# Patient Record
Sex: Female | Born: 1980 | Race: White | Hispanic: No | Marital: Married | Smoking: Former smoker
Health system: Southern US, Community
[De-identification: ages and names within clinical notes are randomized; demographics above are authoritative.]

## PROBLEM LIST (undated history)

## (undated) HISTORY — PX: RHINOPLASTY: SUR1284

---

## 2009-05-26 ENCOUNTER — Ambulatory Visit: Payer: Self-pay | Admitting: Family Medicine

## 2009-05-26 ENCOUNTER — Other Ambulatory Visit: Admission: RE | Admit: 2009-05-26 | Discharge: 2009-05-26 | Payer: Self-pay | Admitting: Family Medicine

## 2009-05-26 ENCOUNTER — Encounter: Payer: Self-pay | Admitting: Family Medicine

## 2009-05-26 DIAGNOSIS — R635 Abnormal weight gain: Secondary | ICD-10-CM | POA: Insufficient documentation

## 2009-05-26 DIAGNOSIS — G4700 Insomnia, unspecified: Secondary | ICD-10-CM | POA: Insufficient documentation

## 2009-05-27 LAB — CONVERTED CEMR LAB
ALT: 11 units/L (ref 0–35)
AST: 14 units/L (ref 0–37)
Albumin: 4.8 g/dL (ref 3.5–5.2)
Alkaline Phosphatase: 41 units/L (ref 39–117)
BUN: 13 mg/dL (ref 6–23)
CO2: 22 meq/L (ref 19–32)
Calcium: 10 mg/dL (ref 8.4–10.5)
Chloride: 105 meq/L (ref 96–112)
Cholesterol: 219 mg/dL — ABNORMAL HIGH (ref 0–200)
Creatinine, Ser: 0.85 mg/dL (ref 0.40–1.20)
Glucose, Bld: 91 mg/dL (ref 70–99)
HDL: 62 mg/dL (ref >39)
LDL Cholesterol: 130 mg/dL — ABNORMAL HIGH (ref 0–99)
Potassium: 4.8 meq/L (ref 3.5–5.3)
Sodium: 140 meq/L (ref 135–145)
TSH: 3.756 microintl units/mL (ref 0.350–4.500)
Total Bilirubin: 0.7 mg/dL (ref 0.3–1.2)
Total CHOL/HDL Ratio: 3.5
Total Protein: 7.6 g/dL (ref 6.0–8.3)
Triglycerides: 136 mg/dL (ref <150)
VLDL: 27 mg/dL (ref 0–40)

## 2009-05-28 LAB — CONVERTED CEMR LAB: Pap Smear: NORMAL

## 2009-06-15 ENCOUNTER — Ambulatory Visit: Payer: Self-pay | Admitting: Family Medicine

## 2009-06-17 ENCOUNTER — Encounter: Payer: Self-pay | Admitting: Family Medicine

## 2009-07-13 ENCOUNTER — Ambulatory Visit: Payer: Self-pay | Admitting: Family Medicine

## 2009-08-24 ENCOUNTER — Ambulatory Visit: Payer: Self-pay | Admitting: Occupational Medicine

## 2009-08-24 LAB — CONVERTED CEMR LAB
Basophils Absolute: 0 10*3/uL (ref 0.0–0.1)
Basophils Relative: 0 % (ref 0–1)
Eosinophils Absolute: 0 10*3/uL (ref 0.0–0.7)
Eosinophils Relative: 0 % (ref 0–5)
HCT: 43.7 % (ref 36.0–46.0)
Hemoglobin: 15.1 g/dL — ABNORMAL HIGH (ref 12.0–15.0)
Lymphocytes Relative: 32 % (ref 12–46)
Lymphs Abs: 2.4 10*3/uL (ref 0.7–4.0)
MCHC: 34.5 g/dL (ref 30.0–36.0)
MCV: 85.5 fL (ref 78.0–100.0)
Monocytes Absolute: 0.2 10*3/uL (ref 0.1–1.0)
Monocytes Relative: 3 % (ref 3–12)
Neutro Abs: 4.9 10*3/uL (ref 1.7–7.7)
Neutrophils Relative %: 65 % (ref 43–77)
Platelets: 248 10*3/uL (ref 150–400)
RBC: 5.11 M/uL (ref 3.87–5.11)
RDW: 12.9 % (ref 11.5–15.5)
WBC: 7.6 10*3/uL (ref 4.0–10.5)

## 2009-10-14 ENCOUNTER — Encounter: Payer: Self-pay | Admitting: Family Medicine

## 2010-01-15 ENCOUNTER — Ambulatory Visit: Payer: Self-pay | Admitting: Family Medicine

## 2010-01-21 ENCOUNTER — Telehealth (INDEPENDENT_AMBULATORY_CARE_PROVIDER_SITE_OTHER): Payer: Self-pay | Admitting: *Deleted

## 2010-06-18 ENCOUNTER — Telehealth (INDEPENDENT_AMBULATORY_CARE_PROVIDER_SITE_OTHER): Payer: Self-pay | Admitting: *Deleted

## 2010-06-18 ENCOUNTER — Ambulatory Visit: Payer: Self-pay | Admitting: Family Medicine

## 2010-06-18 DIAGNOSIS — L819 Disorder of pigmentation, unspecified: Secondary | ICD-10-CM | POA: Insufficient documentation

## 2010-10-13 ENCOUNTER — Encounter: Payer: Self-pay | Admitting: Family Medicine

## 2010-11-16 NOTE — Assessment & Plan Note (Signed)
Summary: Insomnia, elevated BP   Vital Signs:  Patient profile:   30 year old female Height:      69.5 inches Weight:      196 pounds BMI:     28.63 Pulse rate:   90 / minute BP sitting:   140 / 87  (left arm) Cuff size:   regular  Vitals Entered By: Kathlene November (June 15, 2009 3:27 PM) CC: follow-up on medication   Primary Care Provider:  Nani Gasser MD  CC:  follow-up on medication.  History of Present Illness: Chronic Insonmia - Says the seroquel caused too much sedation.  Lasted about 14 hours before it wears off. Feels it is anxiety related. Gets worked up for sleep.  Says has insomnia 5-6/7 days per week.  Was on pristiq and then zoloft for anxiety in 2006 and  really didn't work. Took them in the AM and really didn't help.  We evidently never made the sleep referral.    Wants to review her labs tests.   Has been exercising.   Current Medications (verified): 1)  Quasense 0.15-0.03 Mg Tabs (Levonorgest-Eth Estrad 91-Day) .... Take One Tablet By Mouth Once in The Mornings 2)  Ambien Cr 12.5 Mg Cr-Tabs (Zolpidem Tartrate) .... Take One Tablet By Mouth At Bedtime 3)  Clonazepam 0.5 Mg Tbdp (Clonazepam) .... Take One Tablet By Mouth As Needed For Sleep 4)  Multivitamins  Tabs (Multiple Vitamin) .... Take One Tablet By Mouth Once A Day 5)  Seroquel 25 Mg Tabs (Quetiapine Fumarate) .... Take 1 Tablet By Mouth Once A Day At Bedtime  Allergies (verified): No Known Drug Allergies  Comments:  Nurse/Medical Assistant: The patient's medications and allergies were reviewed with the patient and were updated in the Medication and Allergy Lists. 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 Married to [employer] wtih no kids.   Never Smoked Alcohol use-yes Drug use-no Regular exercise-no  Physical Exam  General:   Well-developed,well-nourished,in no acute distress; alert,appropriate and cooperative throughout examination Head:  Normocephalic and atraumatic without obvious abnormalities. No apparent alopecia or balding.   Impression & Recommendations:  Problem # 1:  INSOMNIA (ICD-780.52) Discussed trial of trazodone. Start with half a tab and can taper up to 100mg . FU in 3-4 weeks to see if worked.  I don't usually favor benzos but her in her case it may really help her, espcially since has tried SNRIs and SSRIs and it didn't improve.  Will work on referral for sleep to Dr. Bloxom Lions.  Her updated medication list for this problem includes:    Ambien Cr 12.5 Mg Cr-tabs (Zolpidem tartrate) .Marland Kitchen... Take one tablet by mouth at bedtime  Problem # 2:  ELEVATED BLOOD PRESSURE WITHOUT DIAGNOSIS OF HYPERTENSION (ICD-796.2) Her weight is normal.  FU in one mo.  Will work on sleep. If not improved then will need to start a medication especially with her family history.   Complete Medication List: 1)  Quasense 0.15-0.03 Mg Tabs (Levonorgest-eth estrad 91-day) .... Take one tablet by mouth once in the mornings 2)  Ambien Cr 12.5 Mg Cr-tabs (Zolpidem tartrate) .... Take one tablet by mouth at bedtime 3)  Clonazepam 0.5 Mg Tbdp (Clonazepam) .... Take one tablet by mouth as needed for sleep 4)  Multivitamins Tabs (Multiple vitamin) .... Take one tablet by mouth once a day 5)  Seroquel 25 Mg Tabs (Quetiapine fumarate) .... Take 1  tablet by mouth once a day at bedtime 6)  Trazodone Hcl 50 Mg Tabs (Trazodone hcl) .... Take 1 tablet by mouth once a day at bedtime. Prescriptions: TRAZODONE HCL 50 MG TABS (TRAZODONE HCL) Take 1 tablet by mouth once a day at bedtime.  #30 x 0   Entered and Authorized by:   Nani Gasser MD   Signed by:   Nani Gasser MD on 06/15/2009   Method used:   Electronically to        [facility]* (retail)       [address] [place], [place]  [address]       Ph: [phone]        Fax: [phone]   RxID:   [id number]

## 2010-11-16 NOTE — Assessment & Plan Note (Signed)
Summary: 6 MO FU BP, HTN   Vital Signs:  Patient profile:   30 year old female Height:      69.5 inches Weight:      198 pounds Pulse rate:   85 / minute BP sitting:   126 / 86  (left arm) Cuff size:   regular  Vitals Entered By: Kathlene November (January 15, 2010 3:44 PM) CC: followup BP   Primary Care Provider:  Nani Gasser MD  CC:  followup BP.  History of Present Illness: Has tried to really increase her activity. Plans on running a 5k this summer. Frustrated that she hasn't lost more weight. Kiara [name] is interested in seeing a nutritionnist. Does eat out about 3 x a week.    Current Medications (verified): 1)  Ambien 10 Mg Tabs (Zolpidem Tartrate) .Marland Kitchen.. 1 By Mouth At Bedtime As Needed 2)  Trazodone Hcl 100 Mg Tabs (Trazodone Hcl) .Marland Kitchen.. 1 By Mouth At Bedtime 3)  Gianvi 3-0.02 Mg Tabs (Drospirenone-Ethinyl Estradiol) .... Take One Tablet By Mouth Once A Day  Allergies (verified): No Known Drug Allergies  Comments:  Nurse/Medical Assistant: The patient's medications and allergies were reviewed with the patient and were updated in the Medication and Allergy Lists. Kathlene November (January 15, 2010 3:45 PM)  Physical Exam  General:  Well-developed,well-nourished,in no acute distress; alert,appropriate and cooperative throughout examination Head:  Normocephalic and atraumatic without obvious abnormalities. No apparent alopecia or balding. Lungs:  Normal respiratory effort, chest expands symmetrically. Lungs are clear to auscultation, no crackles or wheezes. Heart:  Normal rate and regular rhythm. S1 and S2 normal without gallop, murmur, click, rub or other extra sounds. no carotid bruits.    Impression & Recommendations:  Problem # 1:  ELEVATED BLOOD PRESSURE WITHOUT DIAGNOSIS OF HYPERTENSION (ICD-796.2) Stable. F/u 6-69months   Problem # 2:  WEIGHT GAIN (ICD-783.1) Will be happy to refer for nutrition caounseling. Wlll giver her the phone numbners to call.  Continye to get  regular exercise.   Problem # 3:  INSOMNIA (ICD-780.52) Doing well on current regimen. Refills given.  Her updated medication list for this problem includes:    Ambien 10 Mg Tabs (Zolpidem tartrate) .Marland Kitchen... 1 by mouth at bedtime as needed  Complete Medication List: 1)  Ambien 10 Mg Tabs (Zolpidem tartrate) .Marland Kitchen.. 1 by mouth at bedtime as needed 2)  Trazodone Hcl 100 Mg Tabs (Trazodone hcl) .Marland Kitchen.. 1 by mouth at bedtime 3)  Gianvi 3-0.02 Mg Tabs (Drospirenone-ethinyl estradiol) .... Take one tablet by mouth once a day  Patient Instructions: 1)  blood pressure should be under 140/90.  F.U in 6-12 months.  2)  I will call you with the nutrition information.  Prescriptions: GIANVI 3-0.02 MG TABS (DROSPIRENONE-ETHINYL ESTRADIOL) Take one tablet by mouth once a day  #1 pack x 11   Entered and Authorized by:   Nani Gasser MD   Signed by:   Nani Gasser MD on 01/15/2010   Method used:   Printed then faxed to ...       [facility]* (retail)       [address] [place], [place]  [address]       Ph: [phone]       Fax: [phone]   RxID:   [id number] AMBIEN 10 MG TABS (ZOLPIDEM TARTRATE) 1 by mouth at bedtime as needed  #30 x 6   Entered and Authorized by:   Nani Gasser MD   Signed  by:   Nani Gasser MD on 01/15/2010   Method used:   Printed then faxed to ...       [facility]* (retail)       [address] [place], [place]  [address]       Ph: [phone]       Fax: [phone]   RxID:   [id number]

## 2010-11-16 NOTE — Assessment & Plan Note (Signed)
Summary: SORE THROAT/KH   Vital Signs:  Patient Profile:   30 Years Old Female CC:      Sore throat, body aches, x 4 days Height:     69.5 inches Weight:      196 pounds O2 Sat:      97 % O2 treatment:    Room Air Temp:     98.2 degrees F oral Pulse rate:   94 / minute Pulse rhythm:   regular Resp:     16 per minute BP sitting:   122 / 80  (right arm) Cuff size:   regular  Vitals Entered By: Emilio Math (August 24, 2009 11:14 AM)                  Current Allergies: No known allergies History of Present Illness Chief Complaint: Sore throat, body aches, x 4 days History of Present Illness: Very pleasent 30 YO WF presents with a 4 day history of sore throat, generalized body aches.   Says that she might have had a fever 2 days ago.   Overall "just feels lousy".   Denies  sinus congestion, cough, ear pain, abdominal pain, rash. .  Says that 2 weeks ago she had a similar episode of sore throat that resolved in a week.    No other compalints.   Current Meds AMBIEN 10 MG TABS (ZOLPIDEM TARTRATE) 1 by mouth at bedtime as needed MULTIVITAMINS  TABS (MULTIPLE VITAMIN) Take one tablet by mouth once a day TRAZODONE HCL 100 MG TABS (TRAZODONE HCL) 1 by mouth at bedtime LIDOCAINE VISCOUS 2 % SOLN (LIDOCAINE HCL) 5 ml swish and spit  REVIEW OF SYSTEMS Constitutional Symptoms      Denies fever, chills, night sweats, weight loss, weight gain, and fatigue.  Eyes       Denies change in vision, eye pain, eye discharge, glasses, contact lenses, and eye surgery. Ear/Nose/Throat/Mouth       Complains of sore throat.      Denies hearing loss/aids, change in hearing, ear pain, ear discharge, dizziness, frequent runny nose, frequent nose bleeds, sinus problems, hoarseness, and tooth pain or bleeding.  Respiratory       Denies dry cough, productive cough, wheezing, shortness of breath, asthma, bronchitis, and emphysema/COPD.  Cardiovascular       Denies murmurs, chest pain, and tires easily  with exhertion.    Gastrointestinal       Denies stomach pain, nausea/vomiting, diarrhea, constipation, blood in bowel movements, and indigestion. Genitourniary       Denies painful urination, kidney stones, and loss of urinary control. Neurological       Denies paralysis, seizures, and fainting/blackouts. Musculoskeletal       Complains of muscle pain and joint pain.      Denies joint stiffness, decreased range of motion, redness, swelling, muscle weakness, and gout.  Skin       Denies bruising, unusual mles/lumps or sores, and hair/skin or nail changes.  Psych       Denies mood changes, temper/anger issues, anxiety/stress, speech problems, depression, and sleep problems.  Past History:  Past Medical History: Reviewed history from 05/26/2009 and no changes required. none  Past Surgical History: Reviewed history from 05/26/2009 and no changes required. Open rhinoplasty 2007, 2009  Family History: Reviewed history from 05/26/2009 and no changes required. MGM BrCa Father with high cholesterol, HTN  Social History: Reviewed history from 05/26/2009 and no changes required. 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 Married  to Solon Palm wtih no kids.   Never Smoked Alcohol use-yes Drug use-no Regular exercise-no Physical Exam General appearance: well developed, well nourished, no acute distress Head: normocephalic, atraumatic Eyes: conjunctivae and lids normal Ears: normal, no lesions or deformities Nasal: mucosa pink, nonedematous, no septal deviation, turbinates normal Oral/Pharynx: pharyngeal erythema without exudate, uvula midline without deviation Neck: supple,anterior lymphadenopathy present Chest/Lungs: no rales, wheezes, or rhonchi bilateral, breath sounds equal without effort Heart: regular rate and  rhythm, no murmur Assessment New Problems: MONONUCLEOSIS (ICD-075)   Plan New Medications/Changes: LIDOCAINE VISCOUS 2 % SOLN  (LIDOCAINE HCL) 5 ml swish and spit  #4oz x 0, 08/24/2009, Kathrine Haddock MD  New Orders: New Patient Level III [99203] T-CBC w/Diff [54098-11914] T-CBC w/Diff [[id number]] Planning Comments:   Suspect Mono CBC with diff looking for atypical lymphocytes.   monospot test is negative rapid strep test was negative to rule out strep throat.   The patient and/or caregiver has been counseled thoroughly with regard to medications prescribed including dosage, schedule, interactions, rationale for use, and possible side effects and they verbalize understanding.  Diagnoses and expected course of recovery discussed and will return if not improved as expected or if the condition worsens. Patient and/or caregiver verbalized understanding.  Prescriptions: LIDOCAINE VISCOUS 2 % SOLN (LIDOCAINE HCL) 5 ml swish and spit  #4oz x 0   Entered and Authorized by:   Kathrine Haddock MD   Signed by:   Kathrine Haddock MD on 08/24/2009   Method used:   Electronically to        [facility]* (retail)       [address] [place], [place]  [address]       Ph: [phone]       Fax: [phone]   RxID:   [id number]   Patient Instructions: 1)  Take 650-1000mg  of Tylenol every 4-6 hours as needed for relief of pain or comfort of fever AVOID taking more than 4000mg   in a 24 hour period (can cause liver damage in higher doses). 2)  Mono lasts longer than most other viral illnesses (typically 2-4 weeks) .  You should seek medical attention f you experience lightheadedness, abdominal or shoulder pain.   3)  Drink lots of fluids and get plenty of rest.  4)  No contact sports for 1 month.   5)  Follow up as needed.

## 2010-11-16 NOTE — Assessment & Plan Note (Signed)
Summary: Melasma, insomnia   Vital Signs:  Patient profile:   30 year old female Height:      69.5 inches Weight:      199 pounds BMI:     29.07 Pulse rate:   76 / minute BP sitting:   104 / 68  (left arm) Cuff size:   regular  Vitals Entered By: Avon Gully CMA, Duncan Dull) (June 18, 2010 11:15 AM) CC: discuss meds   Primary Care Provider:  Nani Gasser MD  CC:  discuss meds.  History of Present Illness: Really likes being on generic yasmin. Feels it helps her acne but notices some darkening of her skin on the upper lip. Wonders what her options are.  Needs refills on her trazodone and ambien.  Usees the [place] mostly but on some nightes uses the trazodone. Otherwise it is working well and she is tolerating it well.   Current Medications (verified): 1)  Ambien 10 Mg Tabs (Zolpidem Tartrate) .Marland Kitchen.. 1 By Mouth At Bedtime As Needed 2)  Trazodone Hcl 100 Mg Tabs (Trazodone Hcl) .Marland Kitchen.. 1 By Mouth At Bedtime 3)  Gianvi 3-0.02 Mg Tabs (Drospirenone-Ethinyl Estradiol) .... Take One Tablet By Mouth Once A Day  Allergies (verified): No Known Drug Allergies  Comments:  Nurse/Medical Assistant: The patient's medications and allergies were reviewed with the patient and were updated in the Medication and Allergy Lists. Avon Gully CMA, Duncan Dull) (June 18, 2010 11:16 AM)  Physical Exam  General:  Well-developed,well-nourished,in no acute distress; alert,appropriate and cooperative throughout examination Skin:  Has some slight darkening of the upper lip.    Impression & Recommendations:  Problem # 1:  MELASMA (ICD-709.09) Discussed that OCPs in general can cause this darkening of the skin. Can treat with toical creams to lighten it.  Discussed that we could try changnig OCPs to see if helps but it may not work.  She prefers to try that firts. Will change to ortho tricyclen lo and see how it does over the next 4-6  months.  If not helping can consider topical tx or  possible copper T IUD for bith control.   Problem # 2:  INSOMNIA (ICD-780.52) Refilled medications.  Her updated medication list for this problem includes:    Ambien 10 Mg Tabs (Zolpidem tartrate) .Marland Kitchen... 1 by mouth at bedtime as needed  Complete Medication List: 1)  Ambien 10 Mg Tabs (Zolpidem tartrate) .Marland Kitchen.. 1 by mouth at bedtime as needed 2)  Trazodone Hcl 100 Mg Tabs (Trazodone hcl) .Marland Kitchen.. 1 by mouth at bedtime 3)  Ortho Tri-cyclen Lo 0.025 Mg Tabs (Norgestimate-ethinyl estradiol) .... Take 1 tablet by mouth once a day  Other Orders: Tdap => 21yrs IM (91478) Admin 1st Vaccine (29562) Flu Vaccine 9yrs + (13086) Admin of Any Addtl Vaccine (57846) Prescriptions: ORTHO TRI-CYCLEN LO 0.025 MG TABS (NORGESTIMATE-ETHINYL ESTRADIOL) Take 1 tablet by mouth once a day  #1 pack x 5   Entered and Authorized by:   Nani Gasser MD   Signed by:   Nani Gasser MD on 06/18/2010   Method used:   Printed then faxed to ...       [facility]  [phone][facility] (retail)       [address]       [place], [place]  [address]       Ph: [phone]       Fax: [phone]   RxID:   [id number] AMBIEN 10 MG TABS (ZOLPIDEM TARTRATE) 1 by mouth at bedtime as needed  #30 x  6   Entered and Authorized by:   Nani Gasser MD   Signed by:   Nani Gasser MD on 06/18/2010   Method used:   Printed then faxed to ...       [facility]  [phone][facility] (retail)       [address]       [place], [place]  [address]       Ph: [phone]       Fax: [phone]   RxID:   [id number] TRAZODONE HCL 100 MG TABS (TRAZODONE HCL) 1 by mouth at bedtime  #30 x 6   Entered and Authorized by:   Nani Gasser MD   Signed by:   Nani Gasser MD on 06/18/2010   Method used:   Printed then faxed to ...       [facility]  [phone]* (retail)       [address]       [place], [place]  [address]       Ph: [phone]       Fax: [phone]   RxID:   [id number]     Immunizations  Administered:  Tetanus Vaccine:    Vaccine Type: Tdap    Site: left deltoid    Mfr: Boostrix    Dose: 0.5 ml    Route: IM    Given by: Sue Lush McCrimmon CMA, (AAMA)    Exp. Date: 08/05/2012    Lot #: [id number]    VIS given: 09/03/08 version given June 18, 2010.  Influenza Vaccine # 1:    Vaccine Type: Fluvax 3+    Site: right deltoid    Mfr: Fluarix    Dose: 0.5 ml    Route: IM    Given by: Sue Lush McCrimmon CMA, (AAMA)    Exp. Date: 04/16/2011    Lot #: [id number]    VIS given: 05/11/10 version given June 18, 2010.  Flu Vaccine Consent Questions:    Do you have a history of severe allergic reactions to this vaccine? no    Any prior history of allergic reactions to egg and/or gelatin? no    Do you have a sensitivity to the preservative Thimersol? no    Do you have a past history of Guillan-Barre Syndrome? no    Do you currently have an acute febrile illness? no    Have you ever had a severe reaction to latex? no    Vaccine information given and explained to patient? no    Are you currently pregnant? no

## 2010-11-16 NOTE — Assessment & Plan Note (Signed)
Summary: NOV: CPE, PAP   Vital Signs:  Patient profile:   30 year old female Height:      69.5 inches Weight:      196 pounds Pulse rate:   90 / minute BP sitting:   141 / 91  (left arm) Cuff size:   regular  Vitals Entered By: Kathlene November (May 26, 2009 8:26 AM) CC: NP- CPE and pap Is Patient Diabetic? No Pain Assessment Patient in pain? no        Primary Care Provider:  Nani Gasser MD  CC:  NP- CPE and pap.  History of Present Illness: Hx of abnoraml pap in 2007, had a colposcopy. Had a 6 month fu that was normal.  last 5  paps have been normal. Has joined gym.   Hx of chronic insomni since 2006.  Was working with former PCP. Has trouble falling asleep.  Has to rely on Ambien. Has never had a sleep study.  Feels like it is starting to affect her life.  Husband does snore but this doesn't boether her.  Doesn't have a problem staying alseep. Did try an antidepressant route and didn't help.  Was on pristiq. Hasn't tried a mood stabilizer.  Stomtime will have to take 2-3 Ambien.   Habits & Providers  Alcohol-Tobacco-Diet     Alcohol drinks/day: <1     Tobacco Status: never  Exercise-Depression-Behavior     Does Patient Exercise: no     STD Risk: never     Drug Use: no     Seat Belt Use: always  Current Medications (verified): 1)  Quasense 0.15-0.03 Mg Tabs (Levonorgest-Eth Estrad 91-Day) .... Take One Tablet By Mouth Once in The Mornings 2)  Ambien Cr 12.5 Mg Cr-Tabs (Zolpidem Tartrate) .... Take One Tablet By Mouth At Bedtime 3)  Clonazepam 0.5 Mg Tbdp (Clonazepam) .... Take One Tablet By Mouth As Needed For Sleep 4)  Multivitamins  Tabs (Multiple Vitamin) .... Take One Tablet By Mouth Once A Day  Allergies (verified): No Known Drug Allergies  Comments:  Nurse/Medical Assistant: The patient's medications and allergies were reviewed with the patient and were updated in the Medication and Allergy Lists. 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 Married to [employer] wtih no kids.   Never Smoked Alcohol use-yes Drug use-no Regular exercise-no Smoking Status:  never STD Risk:  never Drug Use:  no Seat Belt Use:  always Does Patient Exercise:  no  Review of Systems       No fever/sweats/weakness, unexplained weight loss/gain.  No vison changes.  No difficulty hearing/ringing in ears, hay fever/allergies.  No chest pain/discomfort, palpitations.  No Br lump/nipple discharge.  No cough/wheeze.  No blood in BM, nausea/vomiting/diarrhea.  No nighttime urination, leaking urine, unusual vaginal bleeding, discharge (penis or vagina).  No muscle/joint pain. No rash, change in mole.  No HA, memory loss.  No anxiety, + sleep d/o, no depression.  No easy bruising/bleeding, unexplained lump   Physical Exam  General:  Well-developed,well-nourished,in no acute distress; alert,appropriate and cooperative throughout examination Head:  Normocephalic and atraumatic without obvious abnormalities. No apparent alopecia or balding. Eyes:  No corneal or conjunctival inflammation noted. EOMI. Perrla. Ears:  External ear exam shows no significant lesions or deformities.  Otoscopic examination reveals clear  canals, tympanic membranes are intact bilaterally without bulging, retraction, inflammation or discharge. Hearing is grossly normal bilaterally. Nose:  External nasal examination shows no deformity or inflammation. Nasal mucosa are pink and moist without lesions or exudates. Mouth:  Oral mucosa and oropharynx without lesions or exudates.  Teeth in good repair. Neck:  No deformities, masses, or tenderness noted. Chest Wall:  No deformities, masses, or tenderness noted. Breasts:  No mass, nodules, thickening,  tenderness, bulging, retraction, inflamation, nipple discharge or skin changes noted.   Lungs:  Normal respiratory effort, chest expands symmetrically. Lungs are clear to auscultation, no crackles or wheezes. Heart:  Normal rate and regular rhythm. S1 and S2 normal without gallop, murmur, click, rub or other extra sounds. Abdomen:  Bowel sounds positive,abdomen soft and non-tender without masses, organomegaly or hernias noted. Genitalia:  Normal introitus for age, no external lesions, no vaginal discharge, mucosa pink and moist, no vaginal or cervical lesions, no vaginal atrophy, no friaility or hemorrhage, normal uterus size and position, no adnexal masses or tenderness Msk:  No deformity or scoliosis noted of thoracic or lumbar spine.   Pulses:  R and L carotid,radial,dorsalis pedis and posterior tibial pulses are full and equal bilaterally Extremities:  No clubbing, cyanosis, edema, or deformity noted with normal full range of motion of all joints.   Neurologic:  No cranial nerve deficits noted. Station and gait are normal. DTRs are symmetrical throughout. Sensory, motor and coordinative functions appear intact. Skin:  no rashes.   Cervical Nodes:  No lymphadenopathy noted Axillary Nodes:  No palpable lymphadenopathy Psych:  Cognition and judgment appear intact. Alert and cooperative with normal attention span and concentration. No apparent delusions, illusions, hallucinations   Impression & Recommendations:  Problem # 1:  ROUTINE GYNECOLOGICAL EXAMINATION (ICD-V72.31) Repeat pap in 2 yeas if normal. Screening labs. Her tdap is up to date and has completed the gardasil series.   Happy with her current birth control regimen. Make sure getting regular exercise, she has recently joined a gym and make sure taking her regular calcium supplements.   Orders: T-Comprehensive Metabolic Panel [phone]) T-Lipid Profile [phone]) T-TSH [phone])  Problem # 2:  INSOMNIA  (ICD-780.52) Discussed options including a consult with Dr. Joshua Tree Lions for sleep medicine. Also since she often has to take more than one ambien she may be a better candidate for a mood stabilizer, especially she does feel some of her sleep initiation problem is her anxiety around sleeping. Doesn't feel her anxiety is a problem during the days. Will start with 25mg  seroquel and then can increase to 2 tabs after one week if needed.  Not sure a sleep study would be all that helpful since has problems inititating sleep.   Her updated medication list for this problem includes:    Ambien Cr 12.5 Mg Cr-tabs (Zolpidem tartrate) .Marland Kitchen... Take one tablet by mouth at bedtime  Orders: Sleep Disorder Referral (Sleep Disorder)  Complete Medication List: 1)  Quasense 0.15-0.03 Mg Tabs (Levonorgest-eth estrad 91-day) .... Take one tablet by mouth once in the mornings 2)  Ambien Cr 12.5 Mg Cr-tabs (Zolpidem tartrate) .... Take one tablet by mouth at bedtime 3)  Clonazepam 0.5 Mg Tbdp (Clonazepam) .... Take one tablet by mouth as needed for sleep 4)  Multivitamins Tabs (Multiple vitamin) .... Take one tablet by mouth once a day 5)  Seroquel 25 Mg Tabs (Quetiapine fumarate) .... Take 1 tablet by mouth once a day at bedtime Prescriptions: SEROQUEL 25 MG TABS (QUETIAPINE FUMARATE) Take 1 tablet by mouth  once a day at bedtime  #30 x 0   Entered and Authorized by:   Nani Gasser MD   Signed by:   Nani Gasser MD on 05/26/2009   Method used:   Electronically to        [facility]* (retail)       [address] [place], [place]  [address]       Ph: [phone]       Fax: [phone]   RxID:   [id number]

## 2010-11-16 NOTE — Assessment & Plan Note (Signed)
Summary: 4 WK FU elevated BP, insomnia   Vital Signs:  Patient profile:   30 year old female Weight:      200.25 pounds Temp:     97.6 degrees F oral Pulse rate:   81 / minute BP sitting:   123 / 79  (left arm) Cuff size:   regular  Vitals Entered By: Romualdo Bolk, CMA (AAMA) (July 13, 2009 10:33 AM) CC: Follow-up visit on Blood Pressure   Primary Care Provider:  Nani Gasser MD  CC:  Follow-up visit on Blood Pressure.  History of Present Illness: Follow-up visit on Blood Pressure.  Doing well. Has been sleeping better and did fu wiht Dr. Elkin Lions. See the A&P for detail. No CP or SOB.   Current Medications (verified): 1)  Quasense 0.15-0.03 Mg Tabs (Levonorgest-Eth Estrad 91-Day) .... Take One Tablet By Mouth Once in The Mornings 2)  Ambien 10 Mg Tabs (Zolpidem Tartrate) .Marland Kitchen.. 1 By Mouth At Bedtime 3)  Multivitamins  Tabs (Multiple Vitamin) .... Take One Tablet By Mouth Once A Day 4)  Seroquel 25 Mg Tabs (Quetiapine Fumarate) .... Take 1 Tablet By Mouth Once A Day At Bedtime 5)  Trazodone Hcl 100 Mg Tabs (Trazodone Hcl) .Marland Kitchen.. 1 By Mouth At Bedtime  Allergies (verified): No Known Drug Allergies  Physical Exam  General:  Well-developed,well-nourished,in no acute distress; alert,appropriate and cooperative throughout examination Lungs:  Normal respiratory effort, chest expands symmetrically. Lungs are clear to auscultation, no crackles or wheezes. Heart:  Normal rate and regular rhythm. S1 and S2 normal without gallop, murmur, click, rub or other extra sounds.   Impression & Recommendations:  Problem # 1:  ELEVATED BLOOD PRESSURE WITHOUT DIAGNOSIS OF HYPERTENSION (ICD-796.2) Loks great today. Fu in 6 months. I think really working on her sleep has mad e abig difference.   Problem # 2:  INSOMNIA (ICD-780.52) Says the trazodone has improved her sleep by about 60%.  She has been excited about this. Malvin Johns Dr. Leal Lions for sleep who did feel that it was psychosomatic  and was referred to Dr. Gennie Alma for biofeedback and CBT as well.   Her updated medication list for this problem includes:    Ambien 10 Mg Tabs (Zolpidem tartrate) .Marland Kitchen... 1 by mouth at bedtime as needed  Complete Medication List: 1)  Quasense 0.15-0.03 Mg Tabs (Levonorgest-eth estrad 91-day) .... Take one tablet by mouth once in the mornings 2)  Ambien 10 Mg Tabs (Zolpidem tartrate) .Marland Kitchen.. 1 by mouth at bedtime as needed 3)  Multivitamins Tabs (Multiple vitamin) .... Take one tablet by mouth once a day 4)  Seroquel 25 Mg Tabs (Quetiapine fumarate) .... Take 1 tablet by mouth once a day at bedtime 5)  Trazodone Hcl 100 Mg Tabs (Trazodone hcl) .Marland Kitchen.. 1 by mouth at bedtime  Appended Document: 4 WK FU elevated BP, insomnia    Clinical Lists Changes  Orders: Added new Service order of Flu Vaccine 42yrs + [phone]) - Signed Added new Service order of Admin 1st Vaccine ([id number]) - Signed Observations: Added new observation of FLU VAX#1VIS: 05/10/07 version given July 13, 2009. (07/13/2009 10:59) Added new observation of FLU VAXLOT: [id number] (07/13/2009 10:59) Added new observation of FLU VAX EXP: 04/15/2010 (07/13/2009 10:59) Added new observation of FLU VAXBY: Romualdo Bolk, CMA (AAMA) (07/13/2009 10:59) Added new observation of FLU VAXRTE: IM (07/13/2009 10:59) Added new observation of FLU VAX DSE: 0.5 ml (07/13/2009 10:59) Added new observation of FLU VAXMFR: GlaxoSmithKline (07/13/2009 10:59) Added new observation of FLU VAX  SITE: right deltoid (07/13/2009 10:59) Added new observation of FLU VAX: Fluvax 3+ (07/13/2009 10:59)       Immunizations Administered:  Influenza Vaccine # 1:    Vaccine Type: Fluvax 3+    Site: right deltoid    Mfr: GlaxoSmithKline    Dose: 0.5 ml    Route: IM    Given by: Romualdo Bolk, CMA (AAMA)    Exp. Date: 04/15/2010    Lot #: [id number]    VIS given: 05/10/07 version given July 13, 2009.  Flu Vaccine Consent Questions:    Do you have a  history of severe allergic reactions to this vaccine? no    Any prior history of allergic reactions to egg and/or gelatin? no    Do you have a sensitivity to the preservative Thimersol? no    Do you have a past history of Guillan-Barre Syndrome? no    Do you currently have an acute febrile illness? no    Have you ever had a severe reaction to latex? no    Vaccine information given and explained to patient? yes    Are you currently pregnant? no

## 2010-11-16 NOTE — Miscellaneous (Signed)
Summary: Flu Shot/[place] [name]  Flu Shot/[place] [name]   Imported By: Lanelle Bal 07/28/2009 12:50:23  _____________________________________________________________________  External Attachment:    Type:   Image     Comment:   External Document

## 2010-11-16 NOTE — Letter (Signed)
Summary: [facility]  [facility]   Imported By: Lanelle Bal 10/23/2009 09:38:29  _____________________________________________________________________  External Attachment:    Type:   Image     Comment:   External Document

## 2010-11-16 NOTE — Progress Notes (Signed)
----   Converted from flag ---- ---- 01/20/2010 1:18 PM, Nani Gasser MD wrote: Beverely Low is the nutritionist at [facility] at [facility]. Please give the pt her name and number to call and schedule her own appt.   ---- 01/15/2010 4:06 PM, Nani Gasser MD wrote: Give her name and numbner for Wyona Almas. ------------------------------  Pt given name and number [phone] to call for appt. 01/21/2010 @ 7:52am

## 2010-11-16 NOTE — Consult Note (Signed)
Summary: [facility]  [facility]   Imported By: Lanelle Bal 07/02/2009 10:39:22  _____________________________________________________________________  External Attachment:    Type:   Image     Comment:   External Document

## 2010-11-16 NOTE — Progress Notes (Signed)
Summary: Meds  Phone Note Call from Patient   Caller: Patient Summary of Call: Pt went to [facility] in [place] and Rx have not been sent. Please call Pt once meds have been faxed.  Initial call taken by: Payton Spark CMA,  June 18, 2010 12:27 PM  Follow-up for Phone Call        called and left message on pt's vm that rx are ready to pick up and BCP has been changed to 90 day supply Follow-up by: Avon Gully CMA, Duncan Dull),  June 18, 2010 1:28 PM    Prescriptions: ORTHO TRI-CYCLEN LO 0.025 MG TABS (NORGESTIMATE-ETHINYL ESTRADIOL) Take 1 tablet by mouth once a day  #90 x 1   Entered by:   Avon Gully CMA, (AAMA)   Authorized by:   Nani Gasser MD   Signed by:   Avon Gully CMA, (AAMA) on 06/18/2010   Method used:   Electronically to        [facility]  [phone]* (retail)       [address]       [place], [place]  [address]       Ph: [phone]       Fax: [phone]   RxID:   [id number]

## 2010-11-16 NOTE — Letter (Signed)
Summary: Work Excuse  Work Excuse   Imported By: Junius Finner 08/24/2009 12:23:43  _____________________________________________________________________  External Attachment:    Type:   Image     Comment:   External Document

## 2010-11-18 NOTE — Letter (Signed)
Summary: [facility]  [facility]   Imported By: Lanelle Bal 10/25/2010 12:04:50  _____________________________________________________________________  External Attachment:    Type:   Image     Comment:   External Document

## 2011-03-02 ENCOUNTER — Ambulatory Visit (INDEPENDENT_AMBULATORY_CARE_PROVIDER_SITE_OTHER): Payer: BC Managed Care – PPO | Admitting: Family Medicine

## 2011-03-02 ENCOUNTER — Encounter: Payer: Self-pay | Admitting: Family Medicine

## 2011-03-02 DIAGNOSIS — B009 Herpesviral infection, unspecified: Secondary | ICD-10-CM | POA: Insufficient documentation

## 2011-03-02 DIAGNOSIS — G4700 Insomnia, unspecified: Secondary | ICD-10-CM

## 2011-03-02 DIAGNOSIS — B001 Herpesviral vesicular dermatitis: Secondary | ICD-10-CM | POA: Insufficient documentation

## 2011-03-02 MED ORDER — ZOLPIDEM TARTRATE 10 MG PO TABS: 10.0000 mg | ORAL_TABLET | Freq: Every evening | ORAL | Status: DC | PRN

## 2011-03-02 MED ORDER — CLONAZEPAM 1 MG PO TABS: ORAL_TABLET | ORAL | Status: DC

## 2011-03-02 MED ORDER — VALACYCLOVIR HCL 1 G PO TABS: 2000.0000 mg | ORAL_TABLET | Freq: Two times a day (BID) | ORAL | Status: AC

## 2011-03-02 NOTE — Assessment & Plan Note (Signed)
Since she does well bowel tracts I will send this over to her pharmacy. I gave her enough for 2 outbreaks plus a refill.

## 2011-03-02 NOTE — Progress Notes (Signed)
  Subjective:    Patient ID: Kiara [name], female    DOB: 1981/06/27, 30 y.o.   MRN: [id number]  HPI She is planning on starting neurofeedback for her insomnia.  GEtting brain mapping next week with Cher Nakai (integr[facility]). She was seeing Dr. Shana Chute and her neurologist. She would like me to refill her Ambien and her clonazepam which he uses at bedtime. She brought in her old bottles with her. I will refill these for 6 months.  Cold sore. She's only had one one time before that was approximately 3 years ago. She noticed this lesion on her left upper lip started yesterday. It is tender and somewhat painful. No fever. She thinks it is because she has been stressed lately. She has not had any medications or topical treatment she is using. She has used Valtrex in the past and that worked great.   Review of Systems     Objective:   Physical Exam  Constitutional: She appears well-developed and well-nourished.  Skin:       She does have a cold sore, left upper lip along the crease.          Assessment & Plan:

## 2011-03-02 NOTE — Assessment & Plan Note (Signed)
I did refill her sleep medications. I did warn about dependency off the medications. I also warned about potential side effects of using higher doses of clonazepam in addition to the Ambien. I think it's positive that she is to work on her feedback to try improve her chronic insomnia.

## 2011-05-12 ENCOUNTER — Telehealth: Payer: Self-pay | Admitting: Family Medicine

## 2011-05-12 MED ORDER — ZOLPIDEM TARTRATE 5 MG PO TABS: 5.0000 mg | ORAL_TABLET | Freq: Every evening | ORAL | Status: DC | PRN

## 2011-05-12 NOTE — Telephone Encounter (Signed)
Ok to dec to 5mg  tabs. Thank you.

## 2011-05-12 NOTE — Telephone Encounter (Signed)
Pt called and wants to decrease her ambien (generic) medication to 5 mg if provider will allow.  Has been taking 10 mg.  Please send script if approved to [facility] [address]  Please advise. Jarvis Newcomer, LPN Domingo Dimes

## 2011-05-12 NOTE — Telephone Encounter (Signed)
Ambien 5 mg #30/0refills faxed to [facility] [address].  Discontinued the 10 mg dose. Jarvis Newcomer, LPN Domingo Dimes

## 2011-05-13 ENCOUNTER — Other Ambulatory Visit: Payer: Self-pay | Admitting: Family Medicine

## 2011-05-13 ENCOUNTER — Telehealth: Payer: Self-pay | Admitting: *Deleted

## 2011-05-13 NOTE — Telephone Encounter (Signed)
Pt called and was inquiring about a phone call she made earlier in the week to reduce the mg dosing on he ambien medication. Plan:  Reviewed the chart and the new dose was put in electronically, faxed to the correct pharm on 05-12-11.  Called the pharm and gave the order verbally.  Pt informed. Jarvis Newcomer, LPN Domingo Dimes

## 2012-06-25 ENCOUNTER — Encounter: Payer: Self-pay | Admitting: Family Medicine

## 2012-06-25 ENCOUNTER — Ambulatory Visit (INDEPENDENT_AMBULATORY_CARE_PROVIDER_SITE_OTHER): Payer: BC Managed Care – PPO | Admitting: Family Medicine

## 2012-06-25 VITALS — BP 126/80 | HR 72 | Wt 212.0 lb

## 2012-06-25 DIAGNOSIS — R635 Abnormal weight gain: Secondary | ICD-10-CM

## 2012-06-25 DIAGNOSIS — G4700 Insomnia, unspecified: Secondary | ICD-10-CM

## 2012-06-25 DIAGNOSIS — F411 Generalized anxiety disorder: Secondary | ICD-10-CM

## 2012-06-25 DIAGNOSIS — E785 Hyperlipidemia, unspecified: Secondary | ICD-10-CM

## 2012-06-25 MED ORDER — ALPRAZOLAM 0.5 MG PO TABS: 0.5000 mg | ORAL_TABLET | Freq: Every evening | ORAL | Status: DC | PRN

## 2012-06-25 NOTE — Progress Notes (Signed)
  Subjective:    Patient ID: Kiara [name], female    DOB: [birth year], 31 y.o.   MRN: [id number]  HPI She is off her Ambien after doing neurofeedback for her insomnia. HAs been working on that for a year.  She is still using the clonazepam. Still gets some anxiety at bedtime. Says does ok during the daytime but at night mind races.  Hasn't refilled her clonazepam since July of next year. Says would try Ativan.   Abnormal weight gain - she has been slowly gaining weight . She is working out 3 days per week. Not really tracking her calories.   Review of Systems     Objective:   Physical Exam  Constitutional: She is oriented to person, place, and time. She appears well-developed and well-nourished.  HENT:  Head: Normocephalic and atraumatic.  Cardiovascular: Normal rate, regular rhythm and normal heart sounds.   Pulmonary/Chest: Effort normal and breath sounds normal.  Neurological: She is alert and oriented to person, place, and time.  Skin: Skin is warm and dry.  Psychiatric: She has a normal mood and affect. Her behavior is normal.          Assessment & Plan:  Axniety - GAD - 7 score of 21.  WE discussed options. I dont use ativan for prn benzo use, too highly addictive.  Will change to xanax. Call if any problems. Still warned about potential addictive etc. F/U in 1 year.    Abnormal weight gain. - Discussed options. Encouraged her to work on diet and exercise. Mentioned by [employer] as a resource or joining [employer]. Increase exercise. Can consider discussed weight loss med in the future if not doing well with really focusing on diet and exercise.

## 2012-06-26 LAB — LIPID PANEL
Cholesterol: 176 mg/dL (ref 0–200)
HDL: 56 mg/dL (ref >39)
LDL Cholesterol: 103 mg/dL — ABNORMAL HIGH (ref 0–99)
Total CHOL/HDL Ratio: 3.1 Ratio
Triglycerides: 83 mg/dL (ref <150)
VLDL: 17 mg/dL (ref 0–40)

## 2012-06-27 LAB — COMPLETE METABOLIC PANEL WITH GFR
ALT: 11 U/L (ref 0–35)
AST: 15 U/L (ref 0–37)
Albumin: 4.6 g/dL (ref 3.5–5.2)
Alkaline Phosphatase: 67 U/L (ref 39–117)
BUN: 13 mg/dL (ref 6–23)
CO2: 25 mEq/L (ref 19–32)
Calcium: 9.9 mg/dL (ref 8.4–10.5)
Chloride: 104 mEq/L (ref 96–112)
Creat: 0.69 mg/dL (ref 0.50–1.10)
GFR, Est African American: >89 mL/min
GFR, Est Non African American: >89 mL/min
Glucose, Bld: 99 mg/dL (ref 70–99)
Potassium: 4.4 mEq/L (ref 3.5–5.3)
Sodium: 138 mEq/L (ref 135–145)
Total Bilirubin: 1 mg/dL (ref 0.3–1.2)
Total Protein: 7.1 g/dL (ref 6.0–8.3)

## 2012-06-27 LAB — TSH: TSH: 3.6 u[IU]/mL (ref 0.350–4.500)

## 2012-11-06 ENCOUNTER — Other Ambulatory Visit: Payer: Self-pay | Admitting: Family Medicine

## 2012-11-07 ENCOUNTER — Other Ambulatory Visit: Payer: Self-pay | Admitting: *Deleted

## 2012-11-07 NOTE — Telephone Encounter (Signed)
Pt calls and request a refill on her Xanax.

## 2012-11-07 NOTE — Telephone Encounter (Signed)
Ok to refill but remind her to use sparingly.  Her anxiety wasn't well controlled when last here.

## 2012-11-08 NOTE — Telephone Encounter (Signed)
Is this ok to fill? 

## 2012-11-08 NOTE — Telephone Encounter (Signed)
Yes, ok to refill 

## 2012-11-09 MED ORDER — ALPRAZOLAM 0.5 MG PO TABS: 0.5000 mg | ORAL_TABLET | Freq: Every evening | ORAL | Status: DC | PRN

## 2012-11-13 ENCOUNTER — Ambulatory Visit: Payer: BC Managed Care – PPO | Admitting: Family Medicine

## 2012-11-16 ENCOUNTER — Ambulatory Visit (INDEPENDENT_AMBULATORY_CARE_PROVIDER_SITE_OTHER): Payer: BC Managed Care – PPO | Admitting: Family Medicine

## 2012-11-16 ENCOUNTER — Encounter: Payer: Self-pay | Admitting: Family Medicine

## 2012-11-16 VITALS — BP 115/71 | HR 73 | Ht 70.0 in | Wt 209.0 lb

## 2012-11-16 DIAGNOSIS — F411 Generalized anxiety disorder: Secondary | ICD-10-CM

## 2012-11-16 DIAGNOSIS — R634 Abnormal weight loss: Secondary | ICD-10-CM

## 2012-11-16 MED ORDER — ALPRAZOLAM 0.5 MG PO TABS: 0.5000 mg | ORAL_TABLET | Freq: Every evening | ORAL | Status: AC | PRN

## 2012-11-16 NOTE — Progress Notes (Signed)
  Subjective:    Patient ID: Kiara [name], female    DOB: 08/30/1981, 32 y.o.   MRN: [id number]  HPI GAD-4 month f/U. Still using it at beditme about 3-4 times per days. Doing well with no SE. Says feels really great and wants to not take meds daily.  She is so glad she is off the Ambien.    Tob abuse - Has quit smoking.    Review of Systems     Objective:   Physical Exam  Constitutional: She is oriented to person, place, and time. She appears well-developed and well-nourished.  HENT:  Head: Normocephalic and atraumatic.  Cardiovascular: Normal rate, regular rhythm and normal heart sounds.   Pulmonary/Chest: Effort normal and breath sounds normal.  Neurological: She is alert and oriented to person, place, and time.  Skin: Skin is warm and dry.  Psychiatric: She has a normal mood and affect. Her behavior is normal.          Assessment & Plan:  GAD- GAD-7 score of  9 . Much improved. Will continue current regimen. She feels overall her symptoms are fairly well controlled. She still gets some anxiety at night when she tried to go to sleep but really does well during the daytime. She has gotten back into exercise and eating more healthy as well. She declines to start a different SSRI at this point in time.  Tob abuse - Has quit.   Weight loss- She has lost 3 lbs and ha sbeen eating better and exericsing 5 times per week.

## 2012-11-19 ENCOUNTER — Ambulatory Visit: Payer: BC Managed Care – PPO | Admitting: Family Medicine

## 2013-02-20 ENCOUNTER — Ambulatory Visit: Payer: BC Managed Care – PPO | Admitting: Physician Assistant

## 2013-02-20 ENCOUNTER — Encounter: Payer: Self-pay | Admitting: Physician Assistant

## 2013-02-20 ENCOUNTER — Ambulatory Visit (INDEPENDENT_AMBULATORY_CARE_PROVIDER_SITE_OTHER): Payer: BC Managed Care – PPO | Admitting: Physician Assistant

## 2013-02-20 VITALS — BP 118/73 | HR 80 | Wt 187.0 lb

## 2013-02-20 DIAGNOSIS — L03119 Cellulitis of unspecified part of limb: Secondary | ICD-10-CM

## 2013-02-20 DIAGNOSIS — L02419 Cutaneous abscess of limb, unspecified: Secondary | ICD-10-CM

## 2013-02-20 DIAGNOSIS — L0291 Cutaneous abscess, unspecified: Secondary | ICD-10-CM

## 2013-02-20 MED ORDER — SULFAMETHOXAZOLE-TMP DS 800-160 MG PO TABS: 1.0000 | ORAL_TABLET | Freq: Two times a day (BID) | ORAL | Status: DC

## 2013-02-20 NOTE — Progress Notes (Signed)
  Subjective:    Patient ID: Kiara [name], female    DOB: 1980/12/26, 32 y.o.   MRN: [id number]  HPI Patient presents to the clinic with a bump on her right inner thigh that has been present for over a week and progressively gets bigger and started making it hard to walk. Has not came to a head. Has not done anything to make better. Denies any fever, chills. Has had some slight body aches. Never had anything this big before but has had some bumps in the same area.     Review of Systems     Objective:   Physical Exam  Constitutional: She appears well-developed and well-nourished.  HENT:  Head: Normocephalic and atraumatic.  Skin:  Right upper inner thigh 4cm by 4cm erythema with indurated area in the middle.   Psychiatric: She has a normal mood and affect. Her behavior is normal.          Assessment & Plan:  Abscess and cellulitis- I and D today. Gave handout on treatment. Sent bactrim for 10 days to pharmacy. Keep dry and cover. Signs of infection were reviewed. Follow up in 1 week.   Incision and Drainage Procedure Note  Pre-operative Diagnosis: boil  Post-operative Diagnosis: same  Indications: infection  Anesthesia: 1% plain lidocaine  Procedure Details  The procedure, risks and complications have been discussed in detail (including, but not limited to airway compromise, infection, bleeding) with the patient, and the patient has signed consent to the procedure.  The skin was sterilely prepped and draped over the affected area in the usual fashion. After adequate local anesthesia, I&D with a #11 blade was performed on the right, inner upper thigh. Purulent drainage: present The patient was observed until stable.  Findings: infection  EBL: .5 cc's  Drains: packing was placed  Condition: Tolerated procedure well   Complications: none.

## 2013-02-20 NOTE — Patient Instructions (Signed)
Abscess An abscess is an infected area that contains a collection of pus and debris.It can occur in almost any part of the body. An abscess is also known as a furuncle or boil. CAUSES  An abscess occurs when tissue gets infected. This can occur from blockage of oil or sweat glands, infection of hair follicles, or a minor injury to the skin. As the body tries to fight the infection, pus collects in the area and creates pressure under the skin. This pressure causes pain. People with weakened immune systems have difficulty fighting infections and get certain abscesses more often.  SYMPTOMS Usually an abscess develops on the skin and becomes a painful mass that is red, warm, and tender. If the abscess forms under the skin, you may feel a moveable soft area under the skin. Some abscesses break open (rupture) on their own, but most will continue to get worse without care. The infection can spread deeper into the body and eventually into the bloodstream, causing you to feel ill.  DIAGNOSIS  Your caregiver will take your medical history and perform a physical exam. A sample of fluid may also be taken from the abscess to determine what is causing your infection. TREATMENT  Your caregiver may prescribe antibiotic medicines to fight the infection. However, taking antibiotics alone usually does not cure an abscess. Your caregiver may need to make a small cut (incision) in the abscess to drain the pus. In some cases, gauze is packed into the abscess to reduce pain and to continue draining the area. HOME CARE INSTRUCTIONS   Only take over-the-counter or prescription medicines for pain, discomfort, or fever as directed by your caregiver.  If you were prescribed antibiotics, take them as directed. Finish them even if you start to feel better.  If gauze is used, follow your caregiver's directions for changing the gauze.  To avoid spreading the infection:  Keep your draining abscess covered with a  bandage.  Wash your hands well.  Do not share personal care items, towels, or whirlpools with others.  Avoid skin contact with others.  Keep your skin and clothes clean around the abscess.  Keep all follow-up appointments as directed by your caregiver. SEEK MEDICAL CARE IF:   You have increased pain, swelling, redness, fluid drainage, or bleeding.  You have muscle aches, chills, or a general ill feeling.  You have a fever. MAKE SURE YOU:   Understand these instructions.  Will watch your condition.  Will get help right away if you are not doing well or get worse. Document Released: 07/13/2005 Document Revised: 04/03/2012 Document Reviewed: 12/16/2011 [facility] Patient Information 2013 [place], [place].  Abscess Care After An abscess (also called a boil or furuncle) is an infected area that contains a collection of pus. Signs and symptoms of an abscess include pain, tenderness, redness, or hardness, or you may feel a moveable soft area under your skin. An abscess can occur anywhere in the body. The infection may spread to surrounding tissues causing cellulitis. A cut (incision) by the surgeon was made over your abscess and the pus was drained out. Gauze may have been packed into the space to provide a drain that will allow the cavity to heal from the inside outwards. The boil may be painful for 5 to 7 days. Most people with a boil do not have high fevers. Your abscess, if seen early, may not have localized, and may not have been lanced. If not, another appointment may be required for this if it does  not get better on its own or with medications. HOME CARE INSTRUCTIONS   Only take over-the-counter or prescription medicines for pain, discomfort, or fever as directed by your caregiver.  When you bathe, soak and then remove gauze or iodoform packs at least daily or as directed by your caregiver. You may then wash the wound gently with mild soapy water. Repack with gauze or do as your  caregiver directs. SEEK IMMEDIATE MEDICAL CARE IF:   You develop increased pain, swelling, redness, drainage, or bleeding in the wound site.  You develop signs of generalized infection including muscle aches, chills, fever, or a general ill feeling.  An oral temperature above 102 F (38.9 C) develops, not controlled by medication. See your caregiver for a recheck if you develop any of the symptoms described above. If medications (antibiotics) were prescribed, take them as directed. Document Released: 04/21/2005 Document Revised: 12/26/2011 Document Reviewed: 12/17/2007 [facility] Patient Information 2013 [place], [place].

## 2013-02-27 ENCOUNTER — Ambulatory Visit: Payer: BC Managed Care – PPO | Admitting: Physician Assistant

## 2013-06-12 ENCOUNTER — Other Ambulatory Visit: Payer: Self-pay | Admitting: Family Medicine

## 2013-06-13 ENCOUNTER — Other Ambulatory Visit: Payer: Self-pay | Admitting: Family Medicine

## 2013-07-26 ENCOUNTER — Ambulatory Visit (INDEPENDENT_AMBULATORY_CARE_PROVIDER_SITE_OTHER): Payer: BC Managed Care – PPO | Admitting: Family Medicine

## 2013-07-26 ENCOUNTER — Encounter: Payer: Self-pay | Admitting: Family Medicine

## 2013-07-26 ENCOUNTER — Other Ambulatory Visit: Payer: Self-pay | Admitting: Family Medicine

## 2013-07-26 VITALS — BP 106/62 | HR 53 | Wt 171.0 lb

## 2013-07-26 DIAGNOSIS — F411 Generalized anxiety disorder: Secondary | ICD-10-CM

## 2013-07-26 DIAGNOSIS — R21 Rash and other nonspecific skin eruption: Secondary | ICD-10-CM

## 2013-07-26 DIAGNOSIS — G4700 Insomnia, unspecified: Secondary | ICD-10-CM

## 2013-07-26 MED ORDER — ALPRAZOLAM 0.5 MG PO TABS: ORAL_TABLET | ORAL | Status: DC

## 2013-07-26 MED ORDER — TRAZODONE HCL 50 MG PO TABS: 25.0000 mg | ORAL_TABLET | Freq: Every evening | ORAL | Status: DC | PRN

## 2013-07-26 NOTE — Progress Notes (Signed)
  Subjective:    Patient ID: Kiara [name], female    DOB: 06/09/1981, 33 y.o.   MRN: [id number]  HPI Anxiety  - Has lost 40 lbs since Jan and has been working out. Has a hard time shutting her mid off. She says takes it 2-3 times a week to fall asleep. Says overall her anxiety is not as  High as it was. She says she doesn't really use the medication during the day. She's also been eating healthier.   Review of Systems     Objective:   Physical Exam  Constitutional: She is oriented to person, place, and time. She appears well-developed and well-nourished.  HENT:  Head: Normocephalic and atraumatic.  Right Ear: External ear normal.  Left Ear: External ear normal.  Eyes: EOM are normal.  Neck: Neck supple. No thyromegaly present.  Cardiovascular: Normal rate, regular rhythm and normal heart sounds.   Pulmonary/Chest: Effort normal and breath sounds normal. She has no wheezes.  Lymphadenopathy:    She has no cervical adenopathy.  Neurological: She is alert and oriented to person, place, and time.  Skin: Skin is warm and dry.  Psychiatric: She has a normal mood and affect. Her behavior is normal.          Assessment & Plan:  Anxiety  - GAD-7 score of 11. Based on her score her anxiety is moderate. We discussed potentially taking an SSRI daily. She declined at this time as she will think about it.  Insomnia-continue to use the Xanax sparingly as it can be very habit-forming I reminded her of this. She is also willing to try trazodone to see if this works at least equally as well. This may also reduce her use of the Xanax as well. She's otherwise doing well then followup in one year.  Congratulated her on her 40 pound weight loss that is fantastic.

## 2013-10-23 ENCOUNTER — Other Ambulatory Visit: Payer: Self-pay | Admitting: Family Medicine

## 2013-10-23 ENCOUNTER — Other Ambulatory Visit: Payer: Self-pay | Admitting: *Deleted

## 2013-10-23 NOTE — Telephone Encounter (Signed)
Pt informed that rx has been sent.[name], Kiara Lynetta  

## 2013-12-03 ENCOUNTER — Ambulatory Visit: Payer: BC Managed Care – PPO | Admitting: Physician Assistant

## 2013-12-06 ENCOUNTER — Encounter: Payer: Self-pay | Admitting: Physician Assistant

## 2013-12-06 ENCOUNTER — Ambulatory Visit (INDEPENDENT_AMBULATORY_CARE_PROVIDER_SITE_OTHER): Payer: BC Managed Care – PPO | Admitting: Physician Assistant

## 2013-12-06 VITALS — BP 108/59 | HR 63 | Wt 182.0 lb

## 2013-12-06 DIAGNOSIS — G4700 Insomnia, unspecified: Secondary | ICD-10-CM

## 2013-12-06 DIAGNOSIS — R635 Abnormal weight gain: Secondary | ICD-10-CM

## 2013-12-06 DIAGNOSIS — F419 Anxiety disorder, unspecified: Secondary | ICD-10-CM | POA: Insufficient documentation

## 2013-12-06 DIAGNOSIS — F411 Generalized anxiety disorder: Secondary | ICD-10-CM

## 2013-12-06 MED ORDER — ALPRAZOLAM 0.5 MG PO TABS: ORAL_TABLET | ORAL | Status: DC

## 2013-12-06 NOTE — Addendum Note (Signed)
Addended by: Jomarie LongsBREEBACK, JADE L on: 12/06/2013 03:04 PM   Modules accepted: Level of Service

## 2013-12-06 NOTE — Progress Notes (Addendum)
   Subjective:    Patient ID: Kiara [name], female    DOB: 01/22/1981, 33 y.o.   MRN: [id number]  HPI Pt comes into clinic to follow up on insomnia. She takes xanax as needed for sleep. It works Firefightergreat. She tried trazodone and just doesn't seem to keep her asleep. She does take melatonin at night which does seem to help. She has anxiety but does not feel if effects her life. She exercises fairly regularly. She does not want to try SSRI.   Pt does want to talk about weight loss options. Exercises fairly regularly. No diet plan or calorie counting.    Review of Systems     Objective:   Physical Exam  Constitutional: She is oriented to person, place, and time. She appears well-developed and well-nourished.  HENT:  Head: Normocephalic and atraumatic.  Cardiovascular: Normal rate, regular rhythm and normal heart sounds.   Pulmonary/Chest: Effort normal and breath sounds normal.  Neurological: She is alert and oriented to person, place, and time.  Skin: Skin is dry.  Psychiatric: She has a normal mood and affect. Her behavior is normal.          Assessment & Plan:  Anxiety- GAD-7 was 8. Discussed daily SSRI. Pt declined today. Will continue to manage with exercise.   Insomnia- Refilled xanax. Discussed addictive properties and only use when needed. Continue with melatonin to use nightly 10mg . Follow up in 1 year or at CPE.   Weight gain- BMI is 26 good range. Does not qualify for drugs at this point. Encouraged exercise 30-1hr 4-5 times a week. Load my fitness pal and put in goal weight loss with calories. Balanced healthy diet could help drop a few more lbs.   Spent 30 min with patient and greater than 50 percent of visit spent counseling pt about different weight loss plans and ideas.

## 2014-01-06 ENCOUNTER — Emergency Department
Admission: EM | Admit: 2014-01-06 | Discharge: 2014-01-06 | Disposition: A | Discharge status: Home / Self Care | Payer: BC Managed Care – PPO | Source: Home / Self Care | Attending: Family Medicine | Admitting: Family Medicine

## 2014-01-06 ENCOUNTER — Encounter: Payer: Self-pay | Admitting: Emergency Medicine

## 2014-01-06 DIAGNOSIS — S60459A Superficial foreign body of unspecified finger, initial encounter: Secondary | ICD-10-CM

## 2014-01-06 NOTE — ED Provider Notes (Signed)
CSN: [id number]     Arrival date & time 01/06/14  1353 History   None    Chief Complaint  Patient presents with  . Foreign Body in Skin    splinter      HPI Comments: Kiara [name] reports trying to pick her dog up from her deck about an hour ago and received a splinter in her left 3rd finger nailbed.  Last TDaP 12/2013.   Patient is a 33 y.o. female presenting with hand pain. The history is provided by the patient.  Hand Pain This is a new problem. The current episode started 1 to 2 hours ago. The problem has not changed since onset.Associated symptoms comments: none. Nothing aggravates the symptoms. Nothing relieves the symptoms. She has tried nothing for the symptoms.    History reviewed. No pertinent past medical history. Past Surgical History  Procedure Laterality Date  . Rhinoplasty  2007, 2009   Family History  Problem Relation Age of Onset  . Breast cancer Maternal Grandmother   . Hyperlipidemia Father   . Hypertension Father    History  Substance Use Topics  . Smoking status: Former Smoker    Types: Cigarettes    Quit date: 10/17/2012  . Smokeless tobacco: Never Used  . Alcohol Use: Yes   OB History   Grav Para Term Preterm Abortions TAB SAB Ect Mult Living                 Review of Systems  All other systems reviewed and are negative.    Allergies  Review of patient's allergies indicates no known allergies.  Home Medications   Current Outpatient Rx  Name  Route  Sig  Dispense  Refill  . ALPRAZolam (XANAX) 0.5 MG tablet      TAKE 1 TABLET BY MOUTH AT BEDTIME FOR SLEEP OR ANXIETY   30 tablet   2    BP 131/87  Pulse 67  Temp(Src) 97.6 F (36.4 C) (Oral)  Resp 14  Wt 178 lb (80.74 kg)  SpO2 100%  LMP 12/09/2013 Physical Exam  Nursing note and vitals reviewed. Constitutional: She is oriented to person, place, and time. She appears well-developed and well-nourished.  HENT:  Head: Atraumatic.  Eyes: Conjunctivae are normal. Pupils are equal, round,  and reactive to light.  Musculoskeletal:       Left hand: She exhibits normal range of motion, no tenderness, no bony tenderness, normal two-point discrimination, no deformity and no swelling. Normal sensation noted.       Hands: Left 3rd fingertip has 6mm long wood splinter beneath fingernail; does not extend to nail base.  Neurological: She is alert and oriented to person, place, and time.  Skin: Skin is warm and dry.    ED Course  Procedures  Foreign Body removal fingernail Discussed benefits and risks of procedure and verbal consent obtained. Using sterile technique and digital 2% lidocaine without epinephrine, cleansed fingertip with Betadine.  Splinter easily removed with splinter forceps.  Bacitracin and non-stick bandage applied.  Wound precautions explained to patient.        MDM   1. Acute foreign body of fingernail    Apply Bacitracin and bandage daily until healed. Return for any signs of infection.    Lattie HawStephen A Beese, MD 01/06/14 [phone]

## 2014-01-06 NOTE — ED Notes (Signed)
Kiara [name] reports trying to pick her dog up from her deck about an hour ago and received a splinter in her left 3rd finger nailbed. TDaP rec'd 12/2013.

## 2014-01-06 NOTE — Discharge Instructions (Signed)
Wood Splinters Wood splinters need to be removed because they can cause skin irritation and infection. If they are close to the surface, splinters can usually be removed easily. Deep splinters may be hard to locate and need treatment by a surgeon. SPLINTER REMOVAL Removal of splinters by your caregiver is considered a surgical procedure.   The area is carefully cleaned. You may require a small amount of anaesthesia (medicine injected near the splinter to numb the tissue and lessen pain). After the splinter is removed, the area will be cleaned again. A bandage is applied.  If your splinter is under a fingernail or toenail, then a small section of the nail may need to be removed. As long as the splinter did not extend to the base of the nail, the nail usually grows back normally.  A splinter that is deeper, more contaminated, or that gets near a structure such as a bone, nerve or blood vessel may need to be removed by a Careers advisersurgeon.  You may need special X-rays or scans if the splinter is hard to locate.  Every attempt is made to remove the entire splinter. However, small particles may remain. Tell your caregiver if you feel that a part of the splinter was left behind. HOME CARE INSTRUCTIONS   Keep the injured area high up (elevated).  Use the injured area as little as possible.  Keep the injured area clean and dry. Follow any directions from your caregiver.  Apply Bacitracin and bandage daily until healed.  Keep any follow-up or wound check appointments. You might need a tetanus shot now if:  You have no idea when you had the last one.  You have never had a tetanus shot before.  The injured area had dirt in it. Even if you have already removed the splinter, call your caregiver to get a tetanus shot if you need one.  If you need a tetanus shot, and you decide not to get one, there is a rare chance of getting tetanus. Sickness from tetanus can be serious. If you did get a tetanus shot, your  arm may swell, get red and warm to the touch at the shot site. This is common and not a problem. SEEK MEDICAL CARE IF:   A splinter has been removed, but you are not better in a day or two.  You develop a temperature.  Signs of infection develop such as:  Redness, swelling or pus around the wound.  Red streaks spreading back from your wound towards your body. Document Released: 11/10/2004 Document Revised: 12/26/2011 Document Reviewed: 10/13/2008 [facility] Patient Information 2014 [place].

## 2014-01-11 ENCOUNTER — Telehealth: Payer: Self-pay | Admitting: Emergency Medicine

## 2014-01-29 DIAGNOSIS — R400 Somnolence: Secondary | ICD-10-CM | POA: Insufficient documentation

## 2014-01-29 DIAGNOSIS — R0683 Snoring: Secondary | ICD-10-CM | POA: Insufficient documentation

## 2014-05-06 ENCOUNTER — Ambulatory Visit (INDEPENDENT_AMBULATORY_CARE_PROVIDER_SITE_OTHER): Payer: BC Managed Care – PPO | Admitting: Physician Assistant

## 2014-05-06 ENCOUNTER — Encounter: Payer: Self-pay | Admitting: Physician Assistant

## 2014-05-06 ENCOUNTER — Other Ambulatory Visit: Payer: Self-pay | Admitting: Physician Assistant

## 2014-05-06 VITALS — BP 115/71 | HR 65 | Ht 70.0 in | Wt 187.0 lb

## 2014-05-06 DIAGNOSIS — G4700 Insomnia, unspecified: Secondary | ICD-10-CM

## 2014-05-06 DIAGNOSIS — F411 Generalized anxiety disorder: Secondary | ICD-10-CM

## 2014-05-06 MED ORDER — CITALOPRAM HYDROBROMIDE 10 MG PO TABS: 10.0000 mg | ORAL_TABLET | Freq: Every day | ORAL | Status: DC

## 2014-05-06 MED ORDER — ALPRAZOLAM 0.5 MG PO TABS: ORAL_TABLET | ORAL | Status: DC

## 2014-05-06 NOTE — Progress Notes (Signed)
   Subjective:    Patient ID: Kiara PlantsKirsten [name], female    DOB: 1981/07/26, 33 y.o.   MRN: [id number]  HPI  Patient presents to us ongoing anxiety. She spoke with both me and Dr. Glade LloydMatheny multiple times about her anxiety that occurs around that time. It has gotten significantly worse over the past couple months. She does take Xanax as needed to go to sleep. She does not like the fact that she feels like she needs it nightly. She has some generalized anxiety during the day but it tends to intensify when she lays her head down to sleep. Her mom begins to race and worried about her job, her family, everything and anything. She denies any suicidal or homicidal thoughts. She very rarely feels down or depressed. It has been a long time since she's tried any SSRIs.  Review of Systems  All other systems reviewed and are negative.      Objective:   Physical Exam  Constitutional: She is oriented to person, place, and time. She appears well-developed and well-nourished.  HENT:  Head: Normocephalic and atraumatic.  Cardiovascular: Normal rate, regular rhythm and normal heart sounds.   Pulmonary/Chest: Effort normal and breath sounds normal.  Neurological: She is alert and oriented to person, place, and time.  Skin: Skin is dry.  Psychiatric: She has a normal mood and affect. Her behavior is normal.          Assessment & Plan:   Anxiety/insomnia- PHQ-9 was 7. GAD-7 was 17. Anxiety score has increased from 11 at last visit. We decided on Celexa 10 mg at bedtime. Xanax was refilled to use as needed for insomnia. Additional morning was given not to use nightly but only as needed. Encourage patient to continue using melatonin up to 10 mg nightly to help with sleep. Discussed with patient that medication can take up to 4-6 weeks to get to therapeutic levels. Discussed side effects with patient. If there is any worsening anxiety or depression to stop medication and call office. Patient questions been able to  take care of pregnancy. She is not planning on getting pregnant right now but wanted to know for the future. My suggestion was to speak with her OB/GYN about risks versus benefits. I did tell her that many women she is to stay home antidepressants while being pregnant. There is some data that shows that he can be more harmful to suffer from anxiety during pregnancy to be on the medication. Followup in 4-6 weeks.

## 2014-06-09 ENCOUNTER — Ambulatory Visit (INDEPENDENT_AMBULATORY_CARE_PROVIDER_SITE_OTHER): Payer: BC Managed Care – PPO | Admitting: Physician Assistant

## 2014-06-09 ENCOUNTER — Encounter: Payer: Self-pay | Admitting: Physician Assistant

## 2014-06-09 ENCOUNTER — Other Ambulatory Visit: Payer: Self-pay | Admitting: Physician Assistant

## 2014-06-09 VITALS — BP 117/68 | HR 77 | Ht 70.0 in | Wt 186.0 lb

## 2014-06-09 DIAGNOSIS — F411 Generalized anxiety disorder: Secondary | ICD-10-CM | POA: Diagnosis not present

## 2014-06-09 DIAGNOSIS — G4700 Insomnia, unspecified: Secondary | ICD-10-CM | POA: Diagnosis not present

## 2014-06-09 MED ORDER — SERTRALINE HCL 50 MG PO TABS: 50.0000 mg | ORAL_TABLET | Freq: Every day | ORAL | Status: DC

## 2014-06-09 NOTE — Progress Notes (Signed)
   Subjective:    Patient ID: Kiara [name], female    DOB: 03/31/1981, 33 y.o.   MRN: [id number]  HPI Patient is a 33 year old female who presents to the clinic to followup on anxiety. She was seen last on 05/07/2051 started on Celexa. After 5 days of Celexa she felt quite she was having palpitations. She stopped medications and they went away. She has since been establish with a Veterinary surgeon. She has had one visit. Counselor suggested Zoloft. She would like to try this medication today. She continues to struggle significantly with feeling nervous and on edge. She's having problems going to sleep. She is unable to rest relax. She's very easily irritated and annoyed. Xanax is used as needed and mostly for sleep.    Review of Systems  All other systems reviewed and are negative.      Objective:   Physical Exam  Constitutional: She is oriented to person, place, and time. She appears well-developed and well-nourished.  HENT:  Head: Normocephalic and atraumatic.  Cardiovascular: Normal rate, regular rhythm and normal heart sounds.   Pulmonary/Chest: Effort normal and breath sounds normal.  Neurological: She is alert and oriented to person, place, and time.  Psychiatric: She has a normal mood and affect. Her behavior is normal.          Assessment & Plan:  Anxiety/insomnia- GAD-7 was 19.  will try zoloft  1/2 tab for 7 days then increase to full tablet. Discussed possible side effects. Follow up in 2 months if tolerate and doing well. Continue counseling visit to learn behavioral techniques. Continue to exercise regularly.

## 2014-06-18 ENCOUNTER — Ambulatory Visit: Payer: BC Managed Care – PPO | Admitting: Physician Assistant

## 2014-07-23 ENCOUNTER — Other Ambulatory Visit: Payer: Self-pay | Admitting: Family Medicine

## 2014-07-23 ENCOUNTER — Ambulatory Visit (INDEPENDENT_AMBULATORY_CARE_PROVIDER_SITE_OTHER): Payer: BC Managed Care – PPO | Admitting: Family Medicine

## 2014-07-23 ENCOUNTER — Encounter: Payer: Self-pay | Admitting: Family Medicine

## 2014-07-23 ENCOUNTER — Ambulatory Visit (INDEPENDENT_AMBULATORY_CARE_PROVIDER_SITE_OTHER): Payer: BC Managed Care – PPO

## 2014-07-23 VITALS — BP 127/85 | HR 76 | Ht 70.0 in | Wt 184.0 lb

## 2014-07-23 DIAGNOSIS — M545 Low back pain, unspecified: Secondary | ICD-10-CM

## 2014-07-23 MED ORDER — SERTRALINE HCL 50 MG PO TABS: 50.0000 mg | ORAL_TABLET | Freq: Every day | ORAL | Status: DC

## 2014-07-23 MED ORDER — MELOXICAM 15 MG PO TABS: 15.0000 mg | ORAL_TABLET | Freq: Every day | ORAL | Status: DC

## 2014-07-23 MED ORDER — CYCLOBENZAPRINE HCL 10 MG PO TABS: 10.0000 mg | ORAL_TABLET | Freq: Every evening | ORAL | Status: DC | PRN

## 2014-07-23 NOTE — Progress Notes (Signed)
   Subjective:    Patient ID: Kiara [name], female    DOB: 1981/02/04, 33 y.o.   MRN: [id number]  Back Pain   injured her back 4 wks ago while working out. pt reports that the pain is in her lower back and that it is constant and in certian positions it is sharp.  monday and tuesday were worse and she has only been using heat and tylenol for relief.  No radiation into her lower extremities.  No numbness or tingling.  Pain is 8/10.    GAD-  Review of Systems  Musculoskeletal: Positive for back pain.       Objective:   Physical Exam  Constitutional: She is oriented to person, place, and time. She appears well-developed and well-nourished.  HENT:  Head: Normocephalic and atraumatic.  Musculoskeletal:  Decreased lumbar flexion. Normal extension. Normal rotation right and left. Normal side bending. Only tender over the upper lumbar spine. Nontender over the SI joints or paraspinous muscles. Negative straight leg raise bilaterally. Hip, knee, ankle strength is 5 out of 5 bilaterally. Patellar reflexes 2+ bilaterally.  Neurological: She is alert and oriented to person, place, and time.  Skin: Skin is warm.  Psychiatric: She has a normal mood and affect. Her behavior is normal.          Assessment & Plan:  Acute low back pain - injured during exercise. Her pain is actually been worse for the last few days that was initially. This is a little concerning so we'll go ahead and get x-rays today. In the meantime will try gentle stretches and low back. Hold off on any exercises that are particularly uncomfortable or painful. Continue with heating pad. Will send her a prescription for an anti-inflammatory and a muscle relaxer.

## 2014-07-24 ENCOUNTER — Ambulatory Visit: Payer: BC Managed Care – PPO | Admitting: Physician Assistant

## 2015-01-19 ENCOUNTER — Other Ambulatory Visit: Payer: Self-pay | Admitting: Physician Assistant

## 2015-04-03 ENCOUNTER — Emergency Department
Admission: EM | Admit: 2015-04-03 | Discharge: 2015-04-03 | Disposition: A | Discharge status: Home / Self Care | Payer: BC Managed Care – PPO | Source: Home / Self Care | Attending: Family Medicine | Admitting: Family Medicine

## 2015-04-03 ENCOUNTER — Encounter: Payer: Self-pay | Admitting: *Deleted

## 2015-04-03 DIAGNOSIS — J029 Acute pharyngitis, unspecified: Secondary | ICD-10-CM | POA: Diagnosis not present

## 2015-04-03 DIAGNOSIS — J069 Acute upper respiratory infection, unspecified: Secondary | ICD-10-CM | POA: Diagnosis not present

## 2015-04-03 DIAGNOSIS — B9789 Other viral agents as the cause of diseases classified elsewhere: Principal | ICD-10-CM

## 2015-04-03 LAB — POCT RAPID STREP A (OFFICE): Rapid Strep A Screen: NEGATIVE

## 2015-04-03 MED ORDER — AMOXICILLIN 875 MG PO TABS: 875.0000 mg | ORAL_TABLET | Freq: Two times a day (BID) | ORAL | Status: DC

## 2015-04-03 MED ORDER — BENZONATATE 200 MG PO CAPS: 200.0000 mg | ORAL_CAPSULE | Freq: Every day | ORAL | Status: DC

## 2015-04-03 NOTE — ED Notes (Signed)
Pt c/o sore throat, runny nose aches and cough x 3 days.

## 2015-04-03 NOTE — ED Provider Notes (Signed)
CSN: [id number]     Arrival date & time 04/03/15  4098 History   First MD Initiated Contact with Patient 04/03/15 1015     Chief Complaint  Patient presents with  . Sore Throat      HPI Comments: Patient complains of three day history of typical cold-like symptoms including mild sore throat, sinus congestion, headache, fatigue, and cough.  She has had chills but no fever.  The history is provided by the patient.    History reviewed. No pertinent past medical history. Past Surgical History  Procedure Laterality Date  . Rhinoplasty  2007, 2009   Family History  Problem Relation Age of Onset  . Breast cancer Maternal Grandmother   . Hyperlipidemia Father   . Hypertension Father    History  Substance Use Topics  . Smoking status: Former Smoker    Types: Cigarettes    Quit date: 10/17/2012  . Smokeless tobacco: Never Used  . Alcohol Use: Yes   OB History    No data available     Review of Systems + sore throat + cough No pleuritic pain No wheezing + nasal congestion + post-nasal drainage No sinus pain/pressure + itchy/red eyes No earache No hemoptysis No SOB No fever/chills No nausea No vomiting No abdominal pain No diarrhea No urinary symptoms No skin rash + fatigue No myalgias + headache Used OTC meds without relief  Allergies  Citalopram  Home Medications   Prior to Admission medications   Medication Sig Start Date End Date Taking? Authorizing Provider  ALPRAZolam Prudy Feeler) 0.5 MG tablet TAKE 1 TABLET BY MOUTH EVERY NIGHT AT BEDTIME AS NEEDED FOR SLEEP AND ANXIETY 01/19/15   Agapito Games, MD  amoxicillin (AMOXIL) 875 MG tablet Take 1 tablet (875 mg total) by mouth 2 (two) times daily. (Rx void after 04/11/15) 04/03/15   Lattie Haw, MD  benzonatate (TESSALON) 200 MG capsule Take 1 capsule (200 mg total) by mouth at bedtime. Take as needed for cough 04/03/15   Lattie Haw, MD  cyclobenzaprine (FLEXERIL) 10 MG tablet Take 1 tablet (10 mg total)  by mouth at bedtime as needed for muscle spasms. 07/23/14   Agapito Games, MD  Melatonin 1 MG CAPS Take by mouth.    Historical Provider, MD  meloxicam (MOBIC) 15 MG tablet TAKE 1 TABLET BY MOUTH EVERY DAY 07/23/14   Agapito Games, MD  Prenatal MV-Min-Fe Fum-FA-DHA (PRENATAL MULTIVITAMIN + DHA) 28-0.8 & 200 MG MISC Take 1 tablet by mouth.    Historical Provider, MD  sertraline (ZOLOFT) 50 MG tablet Take 1 tablet (50 mg total) by mouth daily. 07/23/14   Agapito Games, MD   BP 118/75 mmHg  Pulse 78  Temp(Src) 98 F (36.7 C) (Oral)  Resp 16  Wt 185 lb (83.915 kg)  SpO2 100%  LMP 03/13/2015 Physical Exam Nursing notes and Vital Signs reviewed. Appearance:  Patient appears stated age, and in no acute distress Eyes:  Pupils are equal, round, and reactive to light and accomodation.  Extraocular movement is intact.  Conjunctivae are not inflamed  Ears:  Canals normal.  Tympanic membranes normal.  Nose:  Congested turbinates.  No sinus tenderness.     Pharynx:  Uvula slightly edematous Neck:  Supple.  Mildly tender posterior nodes are palpated bilaterally  Lungs:  Clear to auscultation.  Breath sounds are equal.  Heart:  Regular rate and rhythm without murmurs, rubs, or gallops.  Abdomen:  Nontender without masses or hepatosplenomegaly.  Bowel sounds  are present.  No CVA or flank tenderness.  Extremities:  No edema.  No calf tenderness Skin:  No rash present.   ED Course  Procedures  None    Labs Reviewed  POCT RAPID STREP A (OFFICE) negative     MDM   1. Viral URI with cough   2. Acute pharyngitis, unspecified pharyngitis type    There is no evidence of bacterial infection today.  Treat symptomatically for now  Throat culture pending. Prescription written for Benzonatate [facility]) to take at bedtime for night-time cough.  Take plain guaifenesin (1200mg  extended release tabs such as Mucinex) twice daily, with plenty of water, for cough and congestion.  May add  Pseudoephedrine (30mg , one or two every 4 to 6 hours) for sinus congestion.  Get adequate rest.   May use Afrin nasal spray (or generic oxymetazoline) twice daily for about 5 days.  Also recommend using saline nasal spray several times daily and saline nasal irrigation (AYR is a common brand).  U  Try warm salt water gargles for sore throat.  Stop all antihistamines for now, and other non-prescription cough/cold preparations. May take Ibuprofen 200mg , 4 tabs every 8 hours with food for sore throat, headache, etc. Begin Amoxicillin if not improving about one week or if persistent fever develops (Given a prescription to hold, with an expiration date)  Follow-up with family doctor if not improving about10 days.     Lattie Haw, MD 04/03/15 [phone]

## 2015-04-03 NOTE — Discharge Instructions (Signed)
Take plain guaifenesin (1200mg  extended release tabs such as Mucinex) twice daily, with plenty of water, for cough and congestion.  May add Pseudoephedrine (30mg , one or two every 4 to 6 hours) for sinus congestion.  Get adequate rest.   May use Afrin nasal spray (or generic oxymetazoline) twice daily for about 5 days.  Also recommend using saline nasal spray several times daily and saline nasal irrigation (AYR is a common brand).  U  Try warm salt water gargles for sore throat.  Stop all antihistamines for now, and other non-prescription cough/cold preparations. May take Ibuprofen 200mg , 4 tabs every 8 hours with food for sore throat, headache, etc. Begin Amoxicillin if not improving about one week or if persistent fever develops  Follow-up with family doctor if not improving about10 days.

## 2015-04-04 LAB — STREP A DNA PROBE: GASP: NEGATIVE

## 2015-04-06 ENCOUNTER — Telehealth: Payer: Self-pay | Admitting: *Deleted

## 2015-07-15 ENCOUNTER — Other Ambulatory Visit: Payer: Self-pay | Admitting: Family Medicine

## 2015-07-16 ENCOUNTER — Telehealth: Payer: Self-pay | Admitting: *Deleted

## 2015-07-16 NOTE — Telephone Encounter (Signed)
Called pt and lvm informing her that she will need a f/u appt w/Dr. Linford Arnold so that she can get the refills on her Xanax and Zoloft. Pt advised to call ofc to schedule.Kiara [name] [place]

## 2015-07-24 ENCOUNTER — Encounter: Payer: Self-pay | Admitting: Osteopathic Medicine

## 2015-07-24 ENCOUNTER — Ambulatory Visit: Payer: BC Managed Care – PPO | Admitting: Family Medicine

## 2015-07-24 ENCOUNTER — Ambulatory Visit (INDEPENDENT_AMBULATORY_CARE_PROVIDER_SITE_OTHER): Payer: BC Managed Care – PPO | Admitting: Osteopathic Medicine

## 2015-07-24 VITALS — BP 121/71 | HR 61 | Wt 179.0 lb

## 2015-07-24 DIAGNOSIS — F411 Generalized anxiety disorder: Secondary | ICD-10-CM

## 2015-07-24 DIAGNOSIS — G4700 Insomnia, unspecified: Secondary | ICD-10-CM

## 2015-07-24 MED ORDER — SERTRALINE HCL 50 MG PO TABS: 50.0000 mg | ORAL_TABLET | Freq: Every day | ORAL | Status: DC

## 2015-07-24 MED ORDER — ALPRAZOLAM 0.5 MG PO TABS: 0.5000 mg | ORAL_TABLET | Freq: Every evening | ORAL | Status: DC | PRN

## 2015-07-24 NOTE — Progress Notes (Signed)
HPI: Kiara [name] is a 34 y.o. female who presents to [name]  today for chief complaint of:  Chief Complaint  Patient presents with  . f/u anxiety and insomnia    GAD-7 filled out     . Severity: mild, stable . Modifying factors: Taking Zoloft which has helped tremendously. Also taking Xanas as needed for trouble sleeping. Needs to use this only occasionally, 1/2 pill q 2 - 3 weeks.    Past medical, social and family history reviewed: No past medical history on file. Past Surgical History  Procedure Laterality Date  . Rhinoplasty  2007, 2009   Social History  Substance Use Topics  . Smoking status: Former Smoker    Types: Cigarettes    Quit date: 10/17/2012  . Smokeless tobacco: Never Used  . Alcohol Use: Yes   Family History  Problem Relation Age of Onset  . Breast cancer Maternal Grandmother   . Hyperlipidemia Father   . Hypertension Father     Current Outpatient Prescriptions  Medication Sig Dispense Refill  . ALPRAZolam (XANAX) 0.5 MG tablet TAKE 1 TABLET BY MOUTH EVERY NIGHT AT BEDTIME AS NEEDED FOR SLEEP AND ANXIETY 30 tablet 0  . sertraline (ZOLOFT) 50 MG tablet Take 1 tablet (50 mg total) by mouth daily. 90 tablet 1   No current facility-administered medications for this visit.   Allergies  Allergen Reactions  . Citalopram Palpitations    tachycardia tachycardia      Review of Systems: CONSTITUTIONAL: Neg fever/chills, no unintentional weight changes CARDIAC: No chest pain/pressure/palpitations, no orthopnea RESPIRATORY: No cough/shortness of breath/wheeze NEUROLOGIC: No weakness/dizzines/slurred speech PSYCHIATRIC: No concerns with depression/anxiety or sleep problems - stable   Exam:  BP 121/71 mmHg  Pulse 61  Wt 179 lb (81.194 kg) Constitutional: VSS, see above. General Appearance: alert, well-developed, well-nourished, NAD Psychiatric: Normal judgment/insight. Normal mood and affect. Oriented x3.     No results found for this or any previous visit (from the past 72 hour(s)).    ASSESSMENT/PLAN:  Anxiety state  INSOMNIA   Zoloft and Xanax refilled, follow-up with Dr. Eppie Gibson in 6 months.

## 2015-08-27 IMAGING — CR DG LUMBAR SPINE COMPLETE 4+V
5 series · 5 of 5 positions shown · non-contrast
Comparison: None.

CLINICAL DATA: Acute low back pain after using weight machine at
gym.

EXAM:
LUMBAR SPINE - COMPLETE 4+ VIEW

[view not recorded (1 of 5)]
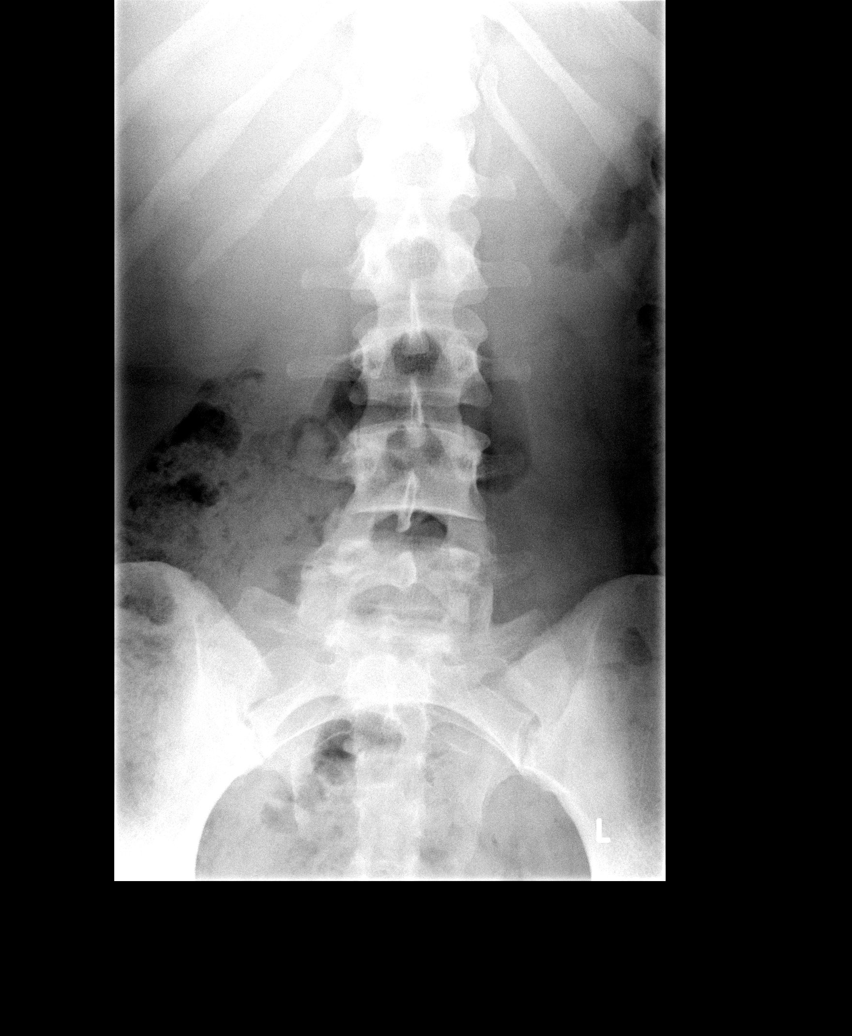

[view not recorded (2 of 5)]
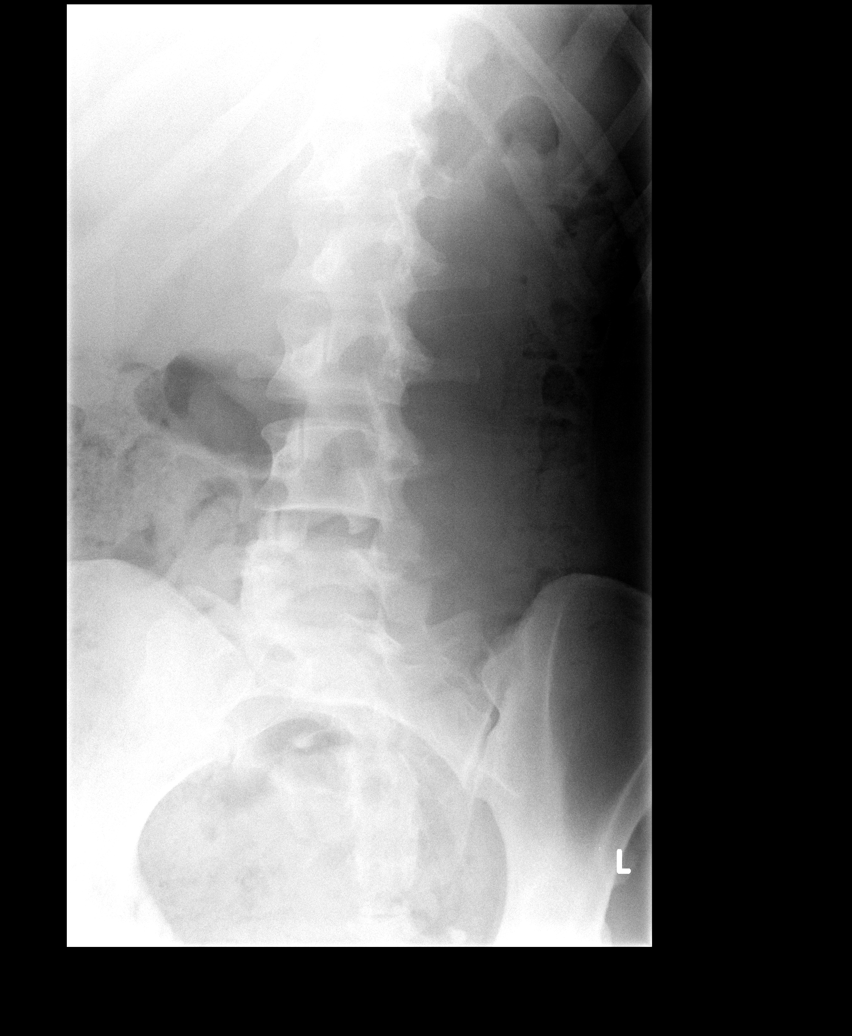

[view not recorded (3 of 5)]
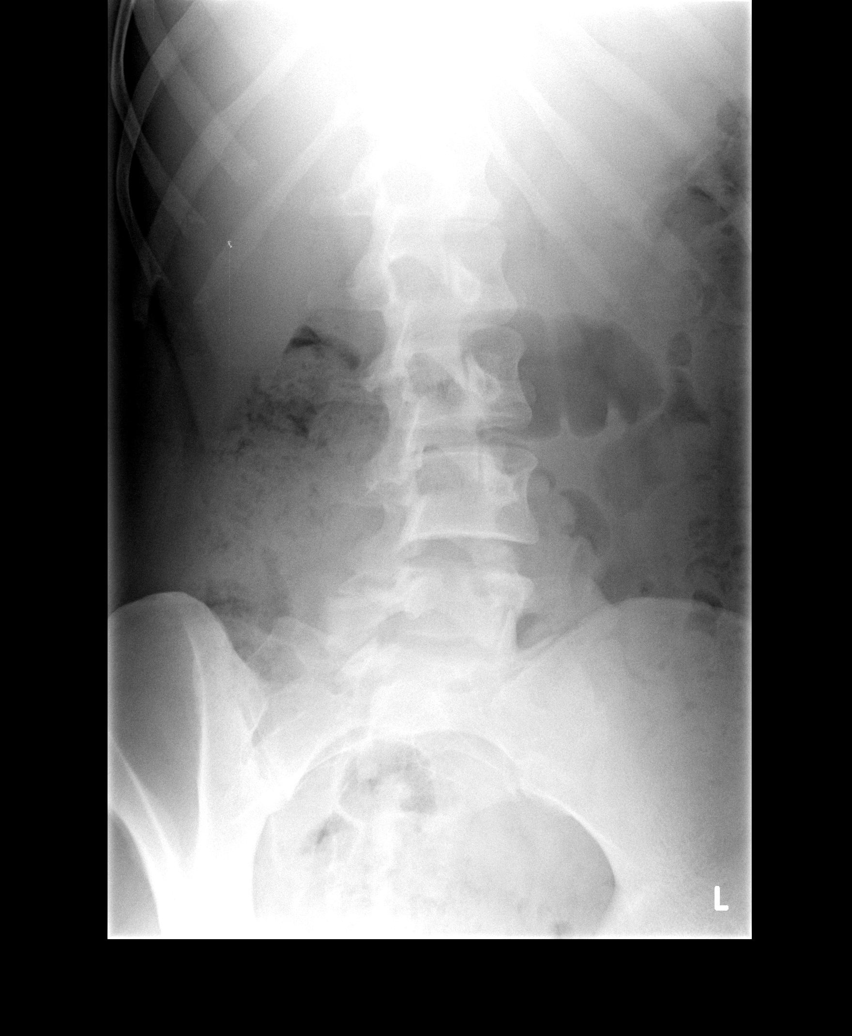

[view not recorded (4 of 5)]
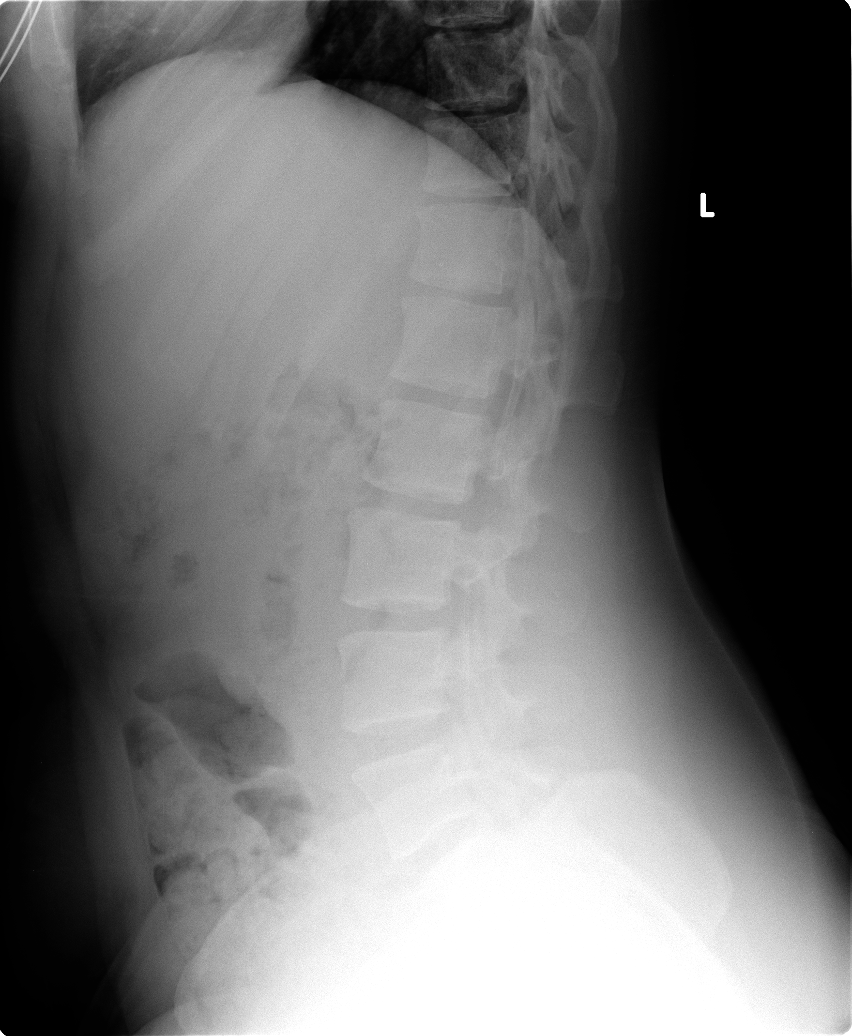

[view not recorded (5 of 5)]
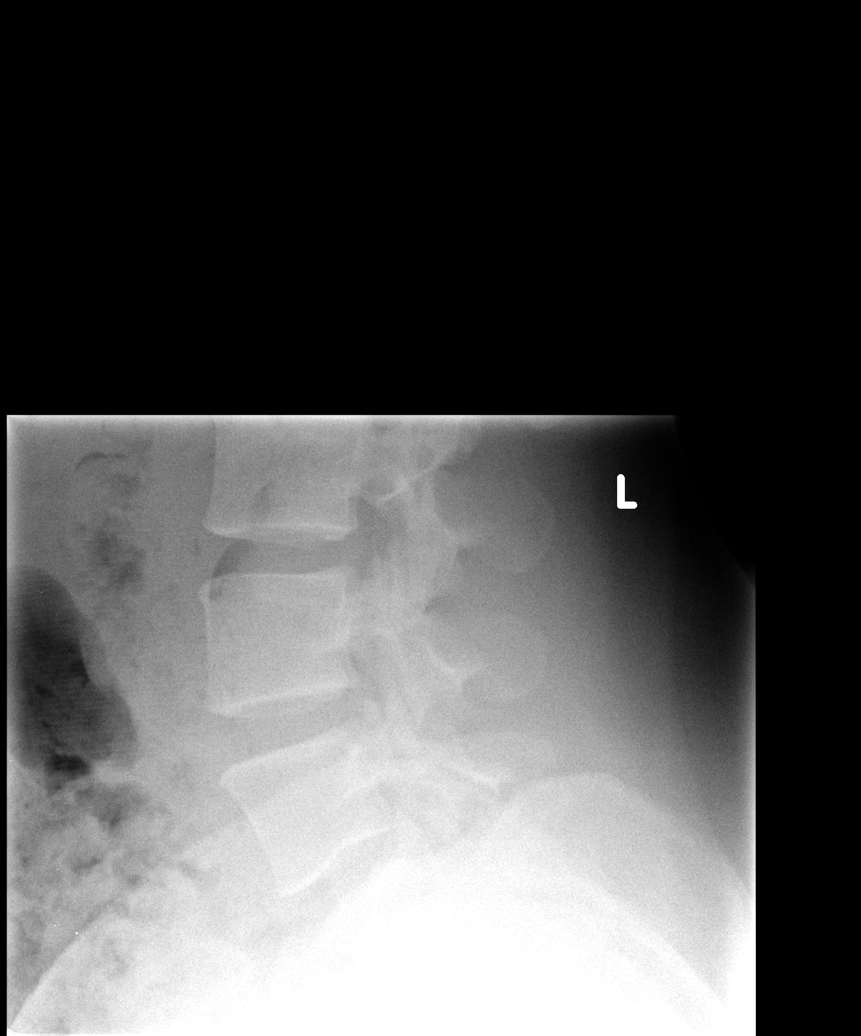

[5 of 5 positions shown; findings below may reference images not displayed]

FINDINGS: There is no evidence of lumbar spine fracture. Alignment is normal.
Intervertebral disc spaces are maintained.
IMPRESSION: Normal lumbar spine.

## 2015-09-28 ENCOUNTER — Ambulatory Visit (INDEPENDENT_AMBULATORY_CARE_PROVIDER_SITE_OTHER): Payer: BC Managed Care – PPO | Admitting: Physician Assistant

## 2015-09-28 ENCOUNTER — Encounter: Payer: Self-pay | Admitting: Physician Assistant

## 2015-09-28 VITALS — BP 113/60 | HR 49 | Ht 70.0 in | Wt 180.0 lb

## 2015-09-28 DIAGNOSIS — H8113 Benign paroxysmal vertigo, bilateral: Secondary | ICD-10-CM

## 2015-09-28 DIAGNOSIS — H8110 Benign paroxysmal vertigo, unspecified ear: Secondary | ICD-10-CM | POA: Insufficient documentation

## 2015-09-28 MED ORDER — MECLIZINE HCL 25 MG PO TABS: 25.0000 mg | ORAL_TABLET | Freq: Three times a day (TID) | ORAL | Status: DC | PRN

## 2015-09-28 NOTE — Patient Instructions (Signed)
antivert as needed up to three times a day.  flonase 2 sprays each nostril once a day.

## 2015-09-28 NOTE — Progress Notes (Signed)
   Subjective:    Patient ID: Kiara [name], female    DOB: 1981-04-08, 34 y.o.   MRN: [id number]  HPI  Patient is a 34 year old female who presents to the clinic with acute onset of dizziness. She woke up at 6 AM this morning and when she went to get up she noticed dizziness. She does feel like it is slightly better than this morning. She drove herself to the office here today. She denies any nausea, sinus pressure, ear pain, fever. She did have a sore throat this morning. She is not coughing. She has not started any new medications. She has never had anything like this before. She has not taken anything to make better.    Review of Systems  All other systems reviewed and are negative.      Objective:   Physical Exam  Constitutional: She is oriented to person, place, and time. She appears well-developed and well-nourished.  HENT:  Head: Normocephalic and atraumatic.  Right Ear: External ear normal.  Left Ear: External ear normal.  Nose: Nose normal.  Mouth/Throat: Oropharynx is clear and moist. No oropharyngeal exudate.  TM's clear bilaterally. Light reflex good. Seems to have some air bubbles more right than left.   Negative maxillary or frontal tenderness to palpation.   Eyes: Conjunctivae are normal. Right eye exhibits no discharge. Left eye exhibits no discharge.  Neck: Normal range of motion. Neck supple.  Cardiovascular: Normal rate, regular rhythm and normal heart sounds.   Pulmonary/Chest: Effort normal and breath sounds normal. She has no wheezes.  Lymphadenopathy:    She has no cervical adenopathy.  Neurological: She is alert and oriented to person, place, and time.  Positive Dix hallpike to the left and right. Worse on the right.   Psychiatric: She has a normal mood and affect. Her behavior is normal.          Assessment & Plan:  Benign positional vertigo, bilateral-discussed diagnosis today. Patient was given Epley maneuvers to start 3 times a day. Antivert was  given for acute dizziness. She was instructed to go home rest and hydrate and do exercises for the next 24 hours. I did see some what could be fluid accumulation behind TMs. Suggested her to start Flonase 2 sprays each nostril daily for the next few days. If not improving in 72 hours or worsening please call office. Reassure patient is on no signs of infection today.

## 2015-11-02 ENCOUNTER — Ambulatory Visit (INDEPENDENT_AMBULATORY_CARE_PROVIDER_SITE_OTHER): Payer: BC Managed Care – PPO

## 2015-11-02 ENCOUNTER — Ambulatory Visit (INDEPENDENT_AMBULATORY_CARE_PROVIDER_SITE_OTHER): Payer: BC Managed Care – PPO | Admitting: Family Medicine

## 2015-11-02 ENCOUNTER — Encounter: Payer: Self-pay | Admitting: Family Medicine

## 2015-11-02 VITALS — BP 117/71 | HR 51 | Wt 176.0 lb

## 2015-11-02 DIAGNOSIS — X58XXXA Exposure to other specified factors, initial encounter: Secondary | ICD-10-CM | POA: Diagnosis not present

## 2015-11-02 DIAGNOSIS — M79672 Pain in left foot: Secondary | ICD-10-CM | POA: Diagnosis not present

## 2015-11-02 DIAGNOSIS — S92345A Nondisplaced fracture of fourth metatarsal bone, left foot, initial encounter for closed fracture: Secondary | ICD-10-CM

## 2015-11-02 DIAGNOSIS — M25775 Osteophyte, left foot: Secondary | ICD-10-CM

## 2015-11-02 DIAGNOSIS — M84375A Stress fracture, left foot, initial encounter for fracture: Secondary | ICD-10-CM | POA: Diagnosis not present

## 2015-11-02 LAB — COMPREHENSIVE METABOLIC PANEL
ALT: 19 U/L (ref 6–29)
AST: 25 U/L (ref 10–30)
Albumin: 4.6 g/dL (ref 3.6–5.1)
Alkaline Phosphatase: 58 U/L (ref 33–115)
BUN: 9 mg/dL (ref 7–25)
CO2: 23 mmol/L (ref 20–31)
Calcium: 9.4 mg/dL (ref 8.6–10.2)
Chloride: 106 mmol/L (ref 98–110)
Creat: 0.74 mg/dL (ref 0.50–1.10)
Glucose, Bld: 84 mg/dL (ref 65–99)
Potassium: 3.9 mmol/L (ref 3.5–5.3)
Sodium: 138 mmol/L (ref 135–146)
Total Bilirubin: 1.2 mg/dL (ref 0.2–1.2)
Total Protein: 7 g/dL (ref 6.1–8.1)

## 2015-11-02 LAB — CBC
HCT: 40.5 % (ref 36.0–46.0)
Hemoglobin: 13.5 g/dL (ref 12.0–15.0)
MCH: 28.8 pg (ref 26.0–34.0)
MCHC: 33.3 g/dL (ref 30.0–36.0)
MCV: 86.4 fL (ref 78.0–100.0)
MPV: 9.3 fL (ref 8.6–12.4)
Platelets: 241 10*3/uL (ref 150–400)
RBC: 4.69 MIL/uL (ref 3.87–5.11)
RDW: 13.2 % (ref 11.5–15.5)
WBC: 6.4 10*3/uL (ref 4.0–10.5)

## 2015-11-02 NOTE — Assessment & Plan Note (Addendum)
4th.  Plan for relative rest. Patient is pain ree with normal activity.  Return in a few weeks with shoes to do gait analysis.

## 2015-11-02 NOTE — Progress Notes (Signed)
Quick Note:  The radiologist read the stress fracture as a real fracture. This does not change our plan at all. ______

## 2015-11-02 NOTE — Patient Instructions (Signed)
Thank you for coming in today. Take it easy.  Listen to your body.  Get labs.  Take at least 1000 units of vit D daily and 2000mg  calcium daily.  Return in 3 weeks or so with your running shoes and shorts.

## 2015-11-02 NOTE — Progress Notes (Signed)
   Subjective:    I'm seeing this patient as a consultation for:  Dr. Linford ArnoldMetheney  CC: left foot pain  HPI: patient has a almost five-week history of left foot pain. The pain started after patient participated in a boot camp type fitness program. She notes pain and swelling in the metatarsal midfoot area. She reduced her activity level which has helped the pain. However she notes that she is unable to run without experiencing pain. She denies any fevers chills nausea vomiting or diarrhea. She has not tried any specific medicine or had any evaluation for this pain yet. Patient is pain-free with normal activities.  Past medical history, Surgical history, Family history not pertinant except as noted below, Social history, Allergies, and medications have been entered into the medical record, reviewed, and no changes needed.   Review of Systems: No headache, visual changes, nausea, vomiting, diarrhea, constipation, dizziness, abdominal pain, skin rash, fevers, chills, night sweats, weight loss, swollen lymph nodes, body aches, joint swelling, muscle aches, chest pain, shortness of breath, mood changes, visual or auditory hallucinations.   Objective:    Filed Vitals:   11/02/15 0948  BP: 117/71  Pulse: 51   General: Well Developed, well nourished, and in no acute distress.  Neuro/Psych: Alert and oriented x3, extra-ocular muscles intact, able to move all 4 extremities, sensation grossly intact. Skin: Warm and dry, no rashes noted.  Respiratory: Not using accessory muscles, speaking in full sentences, trachea midline.  Cardiovascular: Pulses palpable, no extremity edema. Abdomen: Does not appear distended. MSK: left foot  Significant pes planus and ankle pronation with bunion and bunionette formation of the feet bilaterally Left foot no obvious swelling or deformity. The foot is largely nontender with normal motion. Normal pulses capillary refill and sensation. No leg length difference. Normal  gait.  X-ray left foot: Callus formation midshaft left 4th metatarsal indicating healing metatarsal stress fracture.  Awaiting formal radiology read.   No results found for this or any previous visit (from the past 24 hour(s)). Dg Foot Complete Left  11/02/2015  CLINICAL DATA:  Left foot pain EXAM: LEFT FOOT - COMPLETE 3+ VIEW COMPARISON:  None. FINDINGS: There is a nondisplaced fracture of the distal shaft of the fourth metatarsal with surrounding callus formation. There is no evidence of arthropathy or other focal bone abnormality. Soft tissues are unremarkable. IMPRESSION: 1. Nondisplaced fracture of the distal shaft of the fourth metatarsal with surrounding callus formation. Electronically Signed   By: Elige KoHetal  Patel   On: 11/02/2015 10:28    Impression and Recommendations:   This case required medical decision making of moderate complexity.

## 2015-11-03 LAB — VITAMIN D 25 HYDROXY (VIT D DEFICIENCY, FRACTURES): Vit D, 25-Hydroxy: 21 ng/mL — ABNORMAL LOW (ref 30–100)

## 2015-11-03 NOTE — Progress Notes (Signed)
Quick Note:  Vitamin D deficiency noted. Take 4000 units of vitamin D daily over-the-counter.   ______

## 2015-11-23 ENCOUNTER — Ambulatory Visit: Payer: BC Managed Care – PPO | Admitting: Family Medicine

## 2015-12-18 ENCOUNTER — Encounter: Payer: Self-pay | Admitting: Sports Medicine

## 2015-12-18 ENCOUNTER — Ambulatory Visit (INDEPENDENT_AMBULATORY_CARE_PROVIDER_SITE_OTHER): Payer: BC Managed Care – PPO | Admitting: Sports Medicine

## 2015-12-18 VITALS — BP 120/69 | HR 71 | Temp 97.9°F | Wt 181.0 lb

## 2015-12-18 DIAGNOSIS — J300 Vasomotor rhinitis: Secondary | ICD-10-CM | POA: Diagnosis not present

## 2015-12-18 DIAGNOSIS — J029 Acute pharyngitis, unspecified: Secondary | ICD-10-CM

## 2015-12-18 DIAGNOSIS — R52 Pain, unspecified: Secondary | ICD-10-CM

## 2015-12-18 DIAGNOSIS — J310 Chronic rhinitis: Secondary | ICD-10-CM | POA: Insufficient documentation

## 2015-12-18 LAB — POCT RAPID STREP A (OFFICE): Rapid Strep A Screen: NEGATIVE

## 2015-12-18 LAB — POCT INFLUENZA A/B
Influenza A, POC: NEGATIVE
Influenza B, POC: NEGATIVE

## 2015-12-18 MED ORDER — FLUTICASONE PROPIONATE 50 MCG/ACT NA SUSP: NASAL | Status: DC

## 2015-12-18 NOTE — Assessment & Plan Note (Signed)
Acute, with eustachian tube dysfunction. Rapid flu and strep test are negative. Adding intranasal fluticasone, return to CSF no better in 2 weeks.

## 2015-12-18 NOTE — Progress Notes (Signed)
  Subjective:    CC: feeling sick  HPI: This is a pleasant 35 year old female, for the past couple of days she's had increasing sore throat, rhinorrhea, nasal stuffiness, and a sensation of ear fullness. No constitutional symptoms. Moderate, persistent. No shortness of breath, minimal cough is nonproductive.  Past medical history, Surgical history, Family history not pertinant except as noted below, Social history, Allergies, and medications have been entered into the medical record, reviewed, and no changes needed.   Review of Systems: No fevers, chills, night sweats, weight loss, chest pain, or shortness of breath.   Objective:    General: Well Developed, well nourished, and in no acute distress.  Neuro: Alert and oriented x3, extra-ocular muscles intact, sensation grossly intact.  HEENT: Normocephalic, atraumatic, pupils equal round reactive to light, neck supple, no masses, no lymphadenopathy, thyroid nonpalpable. Oropharynx, nasopharynx, ear canals unremarkable with the exception of slightly boggy and erythematous turbinates bones. Skin: Warm and dry, no rashes. Cardiac: Regular rate and rhythm, no murmurs rubs or gallops, no lower extremity edema.  Respiratory: Clear to auscultation bilaterally. Not using accessory muscles, speaking in full sentences.  Rapid flu and strep test are negative  Impression and Recommendations:

## 2015-12-29 ENCOUNTER — Ambulatory Visit (INDEPENDENT_AMBULATORY_CARE_PROVIDER_SITE_OTHER): Payer: BC Managed Care – PPO | Admitting: Family Medicine

## 2015-12-29 ENCOUNTER — Encounter: Payer: Self-pay | Admitting: Family Medicine

## 2015-12-29 VITALS — BP 132/74 | HR 60 | Temp 97.5°F | Wt 183.0 lb

## 2015-12-29 DIAGNOSIS — F411 Generalized anxiety disorder: Secondary | ICD-10-CM | POA: Diagnosis not present

## 2015-12-29 DIAGNOSIS — Z309 Encounter for contraceptive management, unspecified: Secondary | ICD-10-CM | POA: Diagnosis not present

## 2015-12-29 DIAGNOSIS — R3915 Urgency of urination: Secondary | ICD-10-CM | POA: Diagnosis not present

## 2015-12-29 DIAGNOSIS — Z3009 Encounter for other general counseling and advice on contraception: Secondary | ICD-10-CM

## 2015-12-29 LAB — POCT URINALYSIS DIPSTICK
Bilirubin, UA: NEGATIVE
Blood, UA: NEGATIVE
Glucose, UA: NEGATIVE
Ketones, UA: NEGATIVE
Nitrite, UA: NEGATIVE
Protein, UA: NEGATIVE
Spec Grav, UA: <=1.005
Urobilinogen, UA: 0.2
pH, UA: 6

## 2015-12-29 MED ORDER — DROSPIRENONE-ETHINYL ESTRADIOL 3-0.02 MG PO TABS: 1.0000 | ORAL_TABLET | Freq: Every day | ORAL | Status: DC

## 2015-12-29 MED ORDER — ALPRAZOLAM 0.5 MG PO TABS: 0.5000 mg | ORAL_TABLET | Freq: Every evening | ORAL | Status: DC | PRN

## 2015-12-29 MED ORDER — CIPROFLOXACIN HCL 500 MG PO TABS: 500.0000 mg | ORAL_TABLET | Freq: Two times a day (BID) | ORAL | Status: AC

## 2015-12-29 MED ORDER — SERTRALINE HCL 50 MG PO TABS: 50.0000 mg | ORAL_TABLET | Freq: Every day | ORAL | Status: DC

## 2015-12-29 NOTE — Progress Notes (Signed)
Subjective:    Patient ID: Kiara [name], female    DOB: Sep 13, 1981, 35 y.o.   MRN: [id number]  HPI Anxiety - doing well on the sertraline.  Uses her xanax sparingly. She doesn't abuse it. She is doing well overall. Denies feeling nervous on her on edge. She wants to continue the sertraline and does not want to come off of it. In fact she says she wants to take it for life.  Urinary sxs x 2 days.  No blood but increased urgency and feelsling like not emptying adn some middle left low back pain.  No fever, chills. No nauseas.  No AZO.   She wants to restart birth control. She was on it years ago.  She is concerned about weight gain adn moodiness.  She is looking at an ultralow.  She has has looked.  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     Review of Systems   BP 132/74 mmHg  Pulse 60  Temp(Src) 97.5 F (36.4 C) (Oral)  Wt 183 lb (83.008 kg)    Allergies  Allergen Reactions  . Citalopram Palpitations    tachycardia tachycardia    History reviewed. No pertinent past medical history.  Past Surgical History  Procedure Laterality Date  . Rhinoplasty  2007, 2009    Social History   Social History  . Marital Status: Married    Spouse Name: Jeannett SeniorStephen  . Number of Children: N/A  . Years of Education: Masters   Occupational History  . Licensed Geologist, engineeringprofessional counselor     [employer]   Social History Main Topics  . Smoking status: Former Smoker    Types: Cigarettes    Quit date: 10/17/2012  . Smokeless tobacco: Never Used  . Alcohol Use: Yes  . Drug Use: No  . Sexual Activity: Not on file   Other Topics Concern  . Not on file   Social History Narrative   No regular exercise.    Family History  Problem Relation Age of Onset  . Breast cancer Maternal Grandmother   . Hyperlipidemia Father   . Hypertension Father     Outpatient Encounter Prescriptions as of 12/29/2015  Medication Sig  . ALPRAZolam (XANAX) 0.5 MG tablet Take 1  tablet (0.5 mg total) by mouth at bedtime as needed for anxiety or sleep.  Marland Kitchen. sertraline (ZOLOFT) 50 MG tablet Take 1 tablet (50 mg total) by mouth daily.  . ciprofloxacin (CIPRO) 500 MG tablet Take 1 tablet (500 mg total) by mouth 2 (two) times daily.  . drospirenone-ethinyl estradiol (YAZ,GIANVI,LORYNA) 3-0.02 MG tablet Take 1 tablet by mouth daily.  . [DISCONTINUED] fluticasone (FLONASE) 50 MCG/ACT nasal spray One spray in each nostril twice a day, use left hand for right nostril, and right hand for left nostril.   No facility-administered encounter medications on file as of 12/29/2015.          Objective:   Physical Exam  Constitutional: She is oriented to person, place, and time. She appears well-developed and well-nourished.  HENT:  Head: Normocephalic and atraumatic.  Cardiovascular: Normal rate, regular rhythm and normal heart sounds.   Pulmonary/Chest: Effort normal and breath sounds normal.  Neurological: She is alert and oriented to person, place, and time.  Skin: Skin is warm and dry.  Psychiatric: She has a normal mood and affect. Her behavior is normal.        Assessment & Plan:  Anxiety - Well controlled. Continue current regimen. Follow-up in 6  months. GAD 7 score of 1. She rates her symptoms as not difficult.Refilled the Zoloft for 90 day supply and refilled 30 tabs of the Xanax to use sparingly.  Urinary tract infection-urinalysis is negative but we'll go ahead and treat with Cipro since she has classic symptoms. We'll send for culture for further evaluation.  Contraceptive counseling-discussed several low dose birth control options. Will start with gas. Will need to monitor for edema pressure problems, needs change and melasma.

## 2015-12-31 LAB — URINE CULTURE: Colony Count: 2000

## 2016-01-07 ENCOUNTER — Encounter: Payer: Self-pay | Admitting: Family Medicine

## 2016-01-07 ENCOUNTER — Ambulatory Visit (INDEPENDENT_AMBULATORY_CARE_PROVIDER_SITE_OTHER): Payer: BC Managed Care – PPO | Admitting: Family Medicine

## 2016-01-07 ENCOUNTER — Ambulatory Visit (INDEPENDENT_AMBULATORY_CARE_PROVIDER_SITE_OTHER): Payer: BC Managed Care – PPO

## 2016-01-07 VITALS — BP 127/55 | HR 64 | Wt 186.0 lb

## 2016-01-07 DIAGNOSIS — M84375A Stress fracture, left foot, initial encounter for fracture: Secondary | ICD-10-CM

## 2016-01-07 DIAGNOSIS — M216X9 Other acquired deformities of unspecified foot: Secondary | ICD-10-CM

## 2016-01-07 DIAGNOSIS — X58XXXD Exposure to other specified factors, subsequent encounter: Secondary | ICD-10-CM | POA: Diagnosis not present

## 2016-01-07 DIAGNOSIS — R635 Abnormal weight gain: Secondary | ICD-10-CM | POA: Diagnosis not present

## 2016-01-07 DIAGNOSIS — M84375D Stress fracture, left foot, subsequent encounter for fracture with routine healing: Secondary | ICD-10-CM

## 2016-01-07 NOTE — Patient Instructions (Addendum)
Thank you for coming in today. Work on the hip exercises.  Use the scaphoid pad.  Return in 1 month or so.  Advance exercise by about 10% per week guided by pain.   Phentermine sustained-release capsules What is this medicine? PHENTERMINE (FEN ter meen) decreases your appetite. It is used with a reduced calorie diet and exercise to help you lose weight. This medicine may be used for other purposes; ask your health care provider or pharmacist if you have questions. What should I tell my health care provider before I take this medicine? They need to know if you have any of these conditions: -agitation -glaucoma -heart disease -high blood pressure -history of substance abuse -lung disease called Primary Pulmonary Hypertension (PPH) -taken an MAOI like Carbex, Eldepryl, Marplan, Nardil, or Parnate in last 14 days -thyroid disease -an unusual or allergic reaction to phentermine, other medicines, foods, dyes, or preservatives -pregnant or trying to get pregnant -breast-feeding How should I use this medicine? Take this medicine by mouth with a glass of water. Follow the directions on the prescription label. This medicine is usually taken before breakfast or at least 10 to 14 hours before going to bed. Avoid taking this medicine in the evening. It may interfere with sleep. Swallow whole. Do not open or chew the capsules. Take your doses at regular intervals. Do not take your medicine more often than directed. Talk to your pediatrician regarding the use of this medicine in children. Special care may be needed. Overdosage: If you think you have taken too much of this medicine contact a poison control center or emergency room at once. NOTE: This medicine is only for you. Do not share this medicine with others. What if I miss a dose? If you miss a dose, take it as soon as you can. If it is almost time for your next dose, take only that dose. Do not take double or extra doses. What may interact with  this medicine? Do not take this medicine with any of the following medications: -duloxetine -MAOIs like Carbex, Eldepryl, Marplan, Nardil, and Parnate -medicines for colds or breathing difficulties like pseudoephedrine or phenylephrine -procarbazine -sibutramine -SSRIs like citalopram, escitalopram, fluoxetine, fluvoxamine, paroxetine, and sertraline -stimulants like dexmethylphenidate, methylphenidate or modafinil -venlafaxine This medicine may also interact with the following medications: -medicines for diabetes This list may not describe all possible interactions. Give your health care provider a list of all the medicines, herbs, non-prescription drugs, or dietary supplements you use. Also tell them if you smoke, drink alcohol, or use illegal drugs. Some items may interact with your medicine. What should I watch for while using this medicine? Notify your physician immediately if you become short of breath while doing your normal activities. Do not take this medicine within 6 hours of bedtime. It can keep you from getting to sleep. Avoid drinks that contain caffeine and try to stick to a regular bedtime every night. This medicine was intended to be used in addition to a healthy diet and exercise. The best results are achieved this way. This medicine is only indicated for short-term use. Eventually your weight loss may level out. At that point, the drug will only help you maintain your new weight. Do not increase or in any way change your dose without consulting your doctor. You may get drowsy or dizzy. Do not drive, use machinery, or do anything that needs mental alertness until you know how this medicine affects you. Do not stand or sit up quickly, especially if you  are an older patient. This reduces the risk of dizzy or fainting spells. Alcohol may increase dizziness and drowsiness. Avoid alcoholic drinks. What side effects may I notice from receiving this medicine? Side effects that you  should report to your doctor or health care professional as soon as possible: -chest pain, palpitations -depression or severe changes in mood -increased blood pressure -irritability -nervousness or restlessness -severe dizziness -shortness of breath -problems urinating -unusual swelling of the legs -vomiting Side effects that usually do not require medical attention (report to your doctor or health care professional if they continue or are bothersome): -blurred vision or other eye problems -changes in sexual ability or desire -constipation or diarrhea -difficulty sleeping -dry mouth or unpleasant taste -headache -nausea This list may not describe all possible side effects. Call your doctor for medical advice about side effects. You may report side effects to FDA at 1-800-FDA-1088. Where should I keep my medicine? Keep out of the reach of children. This medicine can be abused. Keep your medicine in a safe place to protect it from theft. Do not share this medicine with anyone. Selling or giving away this medicine is dangerous and against the law. This medicine may cause accidental overdose and death if taken by other adults, children, or pets. Mix any unused medicine with a substance like cat litter or coffee grounds. Then throw the medicine away in a sealed container like a sealed bag or a coffee can with a lid. Do not use the medicine after the expiration date. Store at room temperature between 20 and 25 degrees C (68 and 77 degrees F). Keep container tightly closed. NOTE: This sheet is a summary. It may not cover all possible information. If you have questions about this medicine, talk to your doctor, pharmacist, or health care provider.    2016, [employer] Standard. (2014-06-24 16:19:17)   Liraglutide injection (Weight Management) What is this medicine? LIRAGLUTIDE (LIR a GLOO tide) is used with a reduced calorie diet and exercise to help you lose weight. This medicine may be  used for other purposes; ask your health care provider or pharmacist if you have questions. What should I tell my health care provider before I take this medicine? They need to know if you have any of these conditions: -endocrine tumors (MEN 2) or if someone in your family had these tumors -gallstones -high cholesterol -history of alcohol abuse problem -history of pancreatitis -kidney disease or if you are on dialysis -liver disease -previous swelling of the tongue, face, or lips with difficulty breathing, difficulty swallowing, hoarseness, or tightening of the throat -stomach problems -suicidal thoughts, plans, or attempt; a previous suicide attempt by you or a family member -thyroid cancer or if someone in your family had thyroid cancer -an unusual or allergic reaction to liraglutide, medicines, foods, dyes, or preservatives -pregnant or trying to get pregnant -breast-feeding How should I use this medicine? This medicine is for injection under the skin of your upper leg, stomach area, or upper arm. You will be taught how to prepare and give this medicine. Use exactly as directed. Take your medicine at regular intervals. Do not take it more often than directed. It is important that you put your used needles and syringes in a special sharps container. Do not put them in a trash can. If you do not have a sharps container, call your pharmacist or healthcare provider to get one. A special MedGuide will be given to you by the pharmacist with each prescription and refill. Be sure  to read this information carefully each time. Talk to your pediatrician regarding the use of this medicine in children. Special care may be needed. Overdosage: If you think you have taken too much of this medicine contact a poison control center or emergency room at once. NOTE: This medicine is only for you. Do not share this medicine with others. What if I miss a dose? If you miss a dose, take it as soon as you can. If  it is almost time for your next dose, take only that dose. Do not take double or extra doses. If you miss your dose for 3 days or more, call your doctor or health care professional to talk about how to restart this medicine. What may interact with this medicine? -acetaminophen -atorvastatin -birth control pills -digoxin -griseofulvin -lisinopril This list may not describe all possible interactions. Give your health care provider a list of all the medicines, herbs, non-prescription drugs, or dietary supplements you use. Also tell them if you smoke, drink alcohol, or use illegal drugs. Some items may interact with your medicine. What should I watch for while using this medicine? Visit your doctor or health care professional for regular checks on your progress. This medicine is intended to be used in addition to a healthy diet and appropriate exercise. The best results are achieved this way. Do not increase or in any way change your dose without consulting your doctor or health care professional. This medicine may affect blood sugar levels. If you have diabetes, check with your doctor or health care professional before you change your diet or the dose of your diabetic medicine. Patients and their families should watch out for worsening depression or thoughts of suicide. Also watch out for sudden changes in feelings such as feeling anxious, agitated, panicky, irritable, hostile, aggressive, impulsive, severely restless, overly excited and hyperactive, or not being able to sleep. If this happens, especially at the beginning of treatment or after a change in dose, call your health care professional. What side effects may I notice from receiving this medicine? Side effects that you should report to your doctor or health care professional as soon as possible: -allergic reactions like skin rash, itching or hives, swelling of the face, lips, or tongue -breathing problems -fever, chills -loss of  appetite -signs and symptoms of low blood sugar such as feeling anxious, confusion, dizziness, increased hunger, unusually weak or tired, sweating, shakiness, cold, irritable, headache, blurred vision, fast heartbeat, loss of consciousness -trouble passing urine or change in the amount of urine -unusual stomach pain or upset -vomiting Side effects that usually do not require medical attention (Report these to your doctor or health care professional if they continue or are bothersome.): -constipation -diarrhea -fatigue -headache -nausea This list may not describe all possible side effects. Call your doctor for medical advice about side effects. You may report side effects to FDA at 1-800-FDA-1088. Where should I keep my medicine? Keep out of the reach of children. Store unopened pen in a refrigerator between 2 and 8 degrees C (36 and 46 degrees F). Do not freeze or use if the medicine has been frozen. Protect from light and excessive heat. After you first use the pen, it can be stored at room temperature between 15 and 30 degrees C (59 and 86 degrees F) or in a refrigerator. Throw away your used pen after 30 days or after the expiration date, whichever comes first. Do not store your pen with the needle attached. If the needle is left  on, medicine may leak from the pen. NOTE: This sheet is a summary. It may not cover all possible information. If you have questions about this medicine, talk to your doctor, pharmacist, or health care provider.    2016, [employer] Standard. (2013-11-28 12:29:49)\   Bupropion; Naltrexone extended-release tablets What is this medicine? BUPROPION; NALTREXONE (byoo PROE pee on; nal TREX one) is a combination product used to promote and maintain weight loss in obese adults or overweight adults who also have weight related medical problems. This medicine should be used with a reduced calorie diet and increased physical activity. This medicine may be used for other  purposes; ask your health care provider or pharmacist if you have questions. What should I tell my health care provider before I take this medicine? They need to know if you have any of these conditions: -an eating disorder, such as anorexia or bulimia -diabetes -glaucoma -head injury -heart disease -high blood pressure -history of a drug or alcohol abuse problem -history of a tumor or infection of your brain or spine -history of stroke -history of irregular heartbeat -kidney disease -liver disease -mental illness such as bipolar disorder or psychosis -seizures -suicidal thoughts, plans, or attempt; a previous suicide attempt by you or a family member -an unusual or allergic reaction to bupropion, naltrexone, other medicines, foods, dyes, or preservatives breast-feeding -pregnant or trying to become pregnant How should I use this medicine? Take this medicine by mouth with a glass of water. Follow the directions on the prescription label. Take this medicine in the morning and in the evenings as directed by your healthcare professional. Bonita Quin can take it with or without food. Do not take with high-fat meals as this may increase your risk of seizures. Do not crush, chew, or cut these tablets. Do not take your medicine more often than directed. Do not stop taking this medicine suddenly except upon the advice of your doctor. A special MedGuide will be given to you by the pharmacist with each prescription and refill. Be sure to read this information carefully each time. Talk to your pediatrician regarding the use of this medicine in children. Special care may be needed. Overdosage: If you think you have taken too much of this medicine contact a poison control center or emergency room at once. NOTE: This medicine is only for you. Do not share this medicine with others. What if I miss a dose? If you miss a dose, skip the missed dose and take your next tablet at the regular time. Do not take double  or extra doses. What may interact with this medicine? Do not take this medicine with any of the following medications: -any prescription or street opioid drug like codiene, heroin, methadone -linezolid -MAOIs like Carbex, Eldepryl, Marplan, Nardil, and Parnate -methylene blue (injected into a vein) -other medicines that contain bupropion like Zyban or Wellbutrin This medicine may also interact with the following medications: -alcohol -certain medicines for anxiety or sleep -certain medicines for blood pressure like metoprolol, propranolol -certain medicines for depression or psychotic disturbances -certain medicines for HIV or AIDS like efavirenz, lopinavir, nelfinavir, ritonavir -certain medicines for irregular heart beat like propafenone, flecainide -certain medicines for Parkinson's disease like amantadine, levodopa -certain medicines for seizures like carbamazepine, phenytoin, phenobarbital -cimetidine -clopidogrel -cyclophosphamide -disulfiram -furazolidone -isoniazid -nicotine -orphenadrine -procarbazine -steroid medicines like prednisone or cortisone -stimulant medicines for attention disorders, weight loss, or to stay awake -tamoxifen -theophylline -thioridazine -thiotepa -ticlopidine -tramadol -warfarin This list may not describe all possible interactions. Give your  health care provider a list of all the medicines, herbs, non-prescription drugs, or dietary supplements you use. Also tell them if you smoke, drink alcohol, or use illegal drugs. Some items may interact with your medicine. What should I watch for while using this medicine? This medicine is intended to be used in addition to a healthy diet and appropriate exercise. The best results are achieved this way. Do not increase or in any way change your dose without consulting your doctor or health care professional. Do not take this medicine with other prescription or over-the-counter weight loss products without  consulting your doctor or health care professional. Your doctor should tell you to stop taking this medicine if you do not lose a certain amount of weight within the first 12 weeks of treatment. Visit your doctor or health care professional for regular checkups. Your doctor may order blood tests or other tests to see how you are doing. This medicine may affect blood sugar levels. If you have diabetes, check with your doctor or health care professional before you change your diet or the dose of your diabetic medicine. Patients and their families should watch out for new or worsening depression or thoughts of suicide. Also watch out for sudden changes in feelings such as feeling anxious, agitated, panicky, irritable, hostile, aggressive, impulsive, severely restless, overly excited and hyperactive, or not being able to sleep. If this happens, especially at the beginning of treatment or after a change in dose, call your health care professional. Avoid alcoholic drinks while taking this medicine. Drinking large amounts of alcoholic beverages, using sleeping or anxiety medicines, or quickly stopping the use of these agents while taking this medicine may increase your risk for a seizure. What side effects may I notice from receiving this medicine? Side effects that you should report to your doctor or health care professional as soon as possible: -allergic reactions like skin rash, itching or hives, swelling of the face, lips, or tongue -breathing problems -changes in vision, hearing -chest pain -confusion -dark urine -depressed mood -fast or irregular heart beat -fever -hallucination, loss of contact with reality -increased blood pressure -light-colored stools -redness, blistering, peeling or loosening of the skin, including inside the mouth -right upper belly pain -seizures -suicidal thoughts or other mood changes -unusually weak or tired -vomiting -yellowing of the eyes or skin Side effects  that usually do not require medical attention (Report these to your doctor or health care professional if they continue or are bothersome.): -constipation -diarrhea -dizziness -dry mouth -headache -nausea -trouble sleeping This list may not describe all possible side effects. Call your doctor for medical advice about side effects. You may report side effects to FDA at 1-800-FDA-1088. Where should I keep my medicine? Keep out of the reach of children. Store at room temperature between 15 and 30 degrees C (59 and 86 degrees F). Throw away any unused medicine after the expiration date. NOTE: This sheet is a summary. It may not cover all possible information. If you have questions about this medicine, talk to your doctor, pharmacist, or health care provider.    2016, [employer] Standard. (2013-07-10 15:17:29)  Lorcaserin oral tablets What is this medicine? LORCASERIN (lor ca SER in) is used to promote and maintain weight loss in obese patients. This medicine should be used with a reduced calorie diet and, if appropriate, an exercise program. This medicine may be used for other purposes; ask your health care provider or pharmacist if you have questions. What should I tell my health  care provider before I take this medicine? They need to know if you have any of these conditions: -anatomical deformation of the penis, Peyronie's disease, or history of priapism (painful and prolonged erection) -diabetes -heart disease -history of blood diseases, like sickle cell anemia or leukemia -history of irregular heartbeat -kidney disease -liver disease -suicidal thoughts, plans, or attempt; a previous suicide attempt by you or a family member -an unusual or allergic reaction to lorcaserin, other medicines, foods, dyes, or preservatives -pregnant or trying to get pregnant -breast-feeding How should I use this medicine? Take this medicine by mouth with a glass of water. Follow the directions on the  prescription label. You can take it with or without food. Take your medicine at regular intervals. Do not take it more often than directed. Do not stop taking except on your doctor's advice. Talk to your pediatrician regarding the use of this medicine in children. Special care may be needed. Overdosage: If you think you have taken too much of this medicine contact a poison control center or emergency room at once. NOTE: This medicine is only for you. Do not share this medicine with others. What if I miss a dose? If you miss a dose, take it as soon as you can. If it is almost time for your next dose, take only that dose. Do not take double or extra doses. What may interact with this medicine? -cabergoline -certain medicines for depression, anxiety, or psychotic disturbances -certain medicines for erectile dysfunction -certain medicines for migraine headache like almotriptan, eletriptan, frovatriptan, naratriptan, rizatriptan, sumatriptan, zolmitriptan -dextromethorphan -linezolid -lithium -medicines for diabetes -other weight loss products -tramadol -St. John's Wort -stimulant medicines for attention disorders, weight loss, or to stay awake -tryptophan This list may not describe all possible interactions. Give your health care provider a list of all the medicines, herbs, non-prescription drugs, or dietary supplements you use. Also tell them if you smoke, drink alcohol, or use illegal drugs. Some items may interact with your medicine. What should I watch for while using this medicine? This medicine is intended to be used in addition to a healthy diet and appropriate exercise. The best results are achieved this way. Your doctor should instruct you to stop taking this medicine if you do not lose a certain amount of weight within the first 12 weeks of treatment, but it is important that you do not change your dose in any way without consulting your doctor or health care professional. Visit your  doctor or health care professional for regular checkups. Your doctor may order blood tests or other tests to see how you are doing. Do not drive, use machinery, or do anything that needs mental alertness until you know how this medicine affects you. This medicine may affect blood sugar levels. If you have diabetes, check with your doctor or health care professional before you change your diet or the dose of your diabetic medicine. Patients and their families should watch out for worsening depression or thoughts of suicide. Also watch out for sudden changes in feelings such as feeling anxious, agitated, panicky, irritable, hostile, aggressive, impulsive, severely restless, overly excited and hyperactive, or not being able to sleep. If this happens, especially at the beginning of treatment or after a change in dose, call your health care professional. Contact your doctor or health care professional right away if you are a man with an erection that lasts longer than 4 hours or if the erection becomes painful. This may be a sign of serious problem and  must be treated right away to prevent permanent damage. What side effects may I notice from receiving this medicine? Side effects that you should report to your doctor or health care professional as soon as possible: -allergic reactions like skin rash, itching or hives, swelling of the face, lips, or tongue -abnormal production of milk -breast enlargement in both males and females -breathing problems -changes in emotions or moods -changes in vision -confusion -erection lasting more than 4 hours or a painful erection -fast or irregular heart beat -feeling faint or lightheaded, falls -fever or chills, sore throat -hallucination, loss of contact with reality -high or low blood pressure -menstrual changes -restlessness -slow or irregular heartbeat -stiff muscles -sweating -suicidal thoughts or other mood changes -swelling of the ankles, feet,  hands -unusually weak or tired -vomiting Side effects that usually do not require medical attention (Report these to your doctor or health care professional if they continue or are bothersome.): -back pain -constipation -cough -dry mouth -nausea -tiredness This list may not describe all possible side effects. Call your doctor for medical advice about side effects. You may report side effects to FDA at 1-800-FDA-1088. Where should I keep my medicine? Keep out of the reach of children. This medicine can be abused. Keep your medicine in a safe place to protect it from theft. Do not share this medicine with anyone. Selling or giving away this medicine is dangerous and against the law. Store at room temperature between 15 and 30 degrees C (59 and 86 degrees F). Throw away any unused medicine after the expiration date. NOTE: This sheet is a summary. It may not cover all possible information. If you have questions about this medicine, talk to your doctor, pharmacist, or health care provider.    2016, Elsevier/Gold Standard. (2015-05-11 16:21:05)

## 2016-01-07 NOTE — Progress Notes (Signed)
       Kiara [name] is a 35 y.o. female who presents to [name]: Primary Care today for left foot pain. Patient was seen in January for a fourth metatarsal midshaft stress fracture. She was treated with postoperative shoe. In the interim she has done quite well. She is no longer using any immobilization. She is restarted exercise. She denies any pain. She does note an occasional popping sensation in her left lateral foot with activity.  Additionally patient notes that she's gained weight recently with inability to exercise. Should like some help losing weight. She already tries to eat a balanced diet and exercise regularly.   No past medical history on file. Past Surgical History  Procedure Laterality Date  . Rhinoplasty  2007, 2009   Social History  Substance Use Topics  . Smoking status: Former Smoker    Types: Cigarettes    Quit date: 10/17/2012  . Smokeless tobacco: Never Used  . Alcohol Use: Yes   family history includes Breast cancer in her maternal grandmother; Hyperlipidemia in her father; Hypertension in her father.  ROS as above Medications: Current Outpatient Prescriptions  Medication Sig Dispense Refill  . ALPRAZolam (XANAX) 0.5 MG tablet Take 1 tablet (0.5 mg total) by mouth at bedtime as needed for anxiety or sleep. 30 tablet 0  . drospirenone-ethinyl estradiol (YAZ,GIANVI,LORYNA) 3-0.02 MG tablet Take 1 tablet by mouth daily. 1 Package 11  . sertraline (ZOLOFT) 50 MG tablet Take 1 tablet (50 mg total) by mouth daily. 90 tablet 2   No current facility-administered medications for this visit.   Allergies  Allergen Reactions  . Citalopram Palpitations    tachycardia tachycardia     Exam:  BP 127/55 mmHg  Pulse 64  Wt 186 lb (84.369 kg)  LMP 01/03/2016 Gen: Well NAD MSK: Normal-appearing bilaterally. 4+/5 abduction strength bilaterally Feet relatively normal-appearing  with pes planus and pronation bilaterally. Nontender pulses capillary refill sensation intact. Gait analysis: Dynamic excessive pronation and dynamic genu valgum present with running gait. Gait is symmetrical. Improvement and pronation with scaphoid pads.  No results found for this or any previous visit (from the past 24 hour(s)). Dg Foot Complete Left  01/07/2016  CLINICAL DATA:  Recheck metatarsal stress fracture from 3 months ago. EXAM: LEFT FOOT - COMPLETE 3+ VIEW COMPARISON:  11/02/2015 FINDINGS: There is a healing stress fracture of the distal shaft of the fourth metatarsal with persistence of the fracture cleft. There is no evidence of arthropathy or other focal bone abnormality. Soft tissues are unremarkable. IMPRESSION: Healing stress fracture of the distal shaft of the fourth metatarsal with persistence of the fracture cleft. Electronically Signed   By: Kiara KoHetal  [name]   On: 01/07/2016 18:58     Please see individual assessment and plan sections.

## 2016-01-08 DIAGNOSIS — M216X9 Other acquired deformities of unspecified foot: Secondary | ICD-10-CM | POA: Insufficient documentation

## 2016-01-08 NOTE — Assessment & Plan Note (Signed)
Work on scaphoid pads and hip abduction strength

## 2016-01-08 NOTE — Assessment & Plan Note (Signed)
Refer to nutrition counseling. Discussed various medicine options. Recheck in one month

## 2016-01-08 NOTE — Progress Notes (Signed)
Quick Note:  Healing stress fracture present on x-ray. ______

## 2016-01-08 NOTE — Assessment & Plan Note (Signed)
Healing. Advance activity by about 10% per week. Recheck in about a month

## 2016-02-08 ENCOUNTER — Ambulatory Visit: Payer: BC Managed Care – PPO | Admitting: Family Medicine

## 2016-02-10 ENCOUNTER — Ambulatory Visit (INDEPENDENT_AMBULATORY_CARE_PROVIDER_SITE_OTHER): Payer: BC Managed Care – PPO

## 2016-02-10 ENCOUNTER — Encounter: Payer: Self-pay | Admitting: Family Medicine

## 2016-02-10 ENCOUNTER — Ambulatory Visit (INDEPENDENT_AMBULATORY_CARE_PROVIDER_SITE_OTHER): Payer: BC Managed Care – PPO | Admitting: Family Medicine

## 2016-02-10 VITALS — BP 120/76 | HR 60 | Wt 185.0 lb

## 2016-02-10 DIAGNOSIS — R635 Abnormal weight gain: Secondary | ICD-10-CM | POA: Diagnosis not present

## 2016-02-10 DIAGNOSIS — M84375A Stress fracture, left foot, initial encounter for fracture: Secondary | ICD-10-CM

## 2016-02-10 MED ORDER — NALTREXONE-BUPROPION HCL ER 8-90 MG PO TB12: ORAL_TABLET | ORAL | Status: DC

## 2016-02-10 NOTE — Patient Instructions (Signed)
Thank you for coming in today. Return for orthotics.  Start contrave.  Follow up with the RD

## 2016-02-10 NOTE — Assessment & Plan Note (Signed)
Doing well nearly healed. Return for orthotics

## 2016-02-10 NOTE — Progress Notes (Signed)
       Kiara [name] is a 35 y.o. female who [name]: Primary Care today for follow-up foot pain. Patient was seen several months ago for a fourth metatarsal fracture/stress fracture. She notes the pain is almost completely resolved. She notes some persistent clicking and popping in her foot when she walks. She notes that she has extremely flat feet. She's ready to return to exercise if possible.  Additionally patient would like to discuss weight loss options. She is an appointment upcoming with a registered dietitian tomorrow. She is interested in medicines especially Contrave. She identifies as cravings and some binge eating type behaviors especially at night as her Achilles heel when it comes to weight loss. She notes that she's gained weight since she's been unable to exercise normally with her foot fracture.   No past medical history on file. Past Surgical History  Procedure Laterality Date  . Rhinoplasty  2007, 2009   Social History  Substance Use Topics  . Smoking status: Former Smoker    Types: Cigarettes    Quit date: 10/17/2012  . Smokeless tobacco: Never Used  . Alcohol Use: Yes   family history includes Breast cancer in her maternal grandmother; Hyperlipidemia in her father; Hypertension in her father.  ROS as above Medications: Current Outpatient Prescriptions  Medication Sig Dispense Refill  . ALPRAZolam (XANAX) 0.5 MG tablet Take 1 tablet (0.5 mg total) by mouth at bedtime as needed for anxiety or sleep. 30 tablet 0  . drospirenone-ethinyl estradiol (YAZ,GIANVI,LORYNA) 3-0.02 MG tablet Take 1 tablet by mouth daily. 1 Package 11  . sertraline (ZOLOFT) 50 MG tablet Take 1 tablet (50 mg total) by mouth daily. 90 tablet 2  . Naltrexone-Bupropion HCl ER 8-90 MG TB12 1 tab daily for week 1, then 1 tab BID for week 2, then 2 tab PO qAM and 1 tab PO qPM for week 3, then 2 tabs  BID. 80 tablet 0   No current facility-administered medications for this visit.   Allergies  Allergen Reactions  . Citalopram Palpitations    tachycardia tachycardia     Exam:  BP 120/76 mmHg  Pulse 60  Wt 185 lb (83.915 kg)  LMP 02/01/2016  Body mass index is 26.54 kg/(m^2).  Gen: Well NAD Feet extremely pes planus bilaterally with callus formation at the now weightbearing navicular prominence. Foot is nontender normal motion bilaterally. Pulses capillary refill and sensation intact distally.  No results found for this or any previous visit (from the past 24 hour(s)). Dg Foot Complete Left  02/10/2016  CLINICAL DATA:  Stress fracture.  Recurrent injury. EXAM: LEFT FOOT - COMPLETE 3+ VIEW COMPARISON:  01/07/2016.  11/02/2015. FINDINGS: Continued healing of distal left fourth metatarsal stress fracture. Fracture lucency has almost resolved. No new fractures are identified. No other focal abnormality identified. IMPRESSION: Continued healing and distal left fourth metatarsal stress fracture. Fracture lucency has almost resolved. No acute or focal new abnormality identified. Electronically Signed   By: Maisie Fushomas  Register   On: 02/10/2016 09:14     Please see individual assessment and plan sections.

## 2016-02-10 NOTE — Progress Notes (Signed)
Quick Note:  Radiology agrees that the bone is almost completely healed. ______

## 2016-02-10 NOTE — Assessment & Plan Note (Signed)
Prescribe Contrave follow-up with registered dietitian. Recheck in 1 month or so

## 2016-02-11 ENCOUNTER — Telehealth: Payer: Self-pay | Admitting: *Deleted

## 2016-02-11 NOTE — Telephone Encounter (Signed)
PA form faxed for Contrave

## 2016-02-17 NOTE — Telephone Encounter (Signed)
contrave denied due to patient;s BMI 26.54. Denial letter placed in Dr. Zollie Peeorey's box since he prescribed meds

## 2016-02-18 ENCOUNTER — Ambulatory Visit (INDEPENDENT_AMBULATORY_CARE_PROVIDER_SITE_OTHER): Payer: BC Managed Care – PPO | Admitting: Sports Medicine

## 2016-02-18 ENCOUNTER — Encounter: Payer: Self-pay | Admitting: Sports Medicine

## 2016-02-18 VITALS — BP 103/66 | HR 69 | Resp 16 | Wt 186.2 lb

## 2016-02-18 DIAGNOSIS — R300 Dysuria: Secondary | ICD-10-CM

## 2016-02-18 DIAGNOSIS — Z3041 Encounter for surveillance of contraceptive pills: Secondary | ICD-10-CM | POA: Diagnosis not present

## 2016-02-18 DIAGNOSIS — N3001 Acute cystitis with hematuria: Secondary | ICD-10-CM | POA: Diagnosis not present

## 2016-02-18 DIAGNOSIS — N3000 Acute cystitis without hematuria: Secondary | ICD-10-CM | POA: Insufficient documentation

## 2016-02-18 LAB — POCT URINALYSIS DIPSTICK
Bilirubin, UA: NEGATIVE
Glucose, UA: NEGATIVE
Ketones, UA: NEGATIVE
Nitrite, UA: NEGATIVE
Protein, UA: NEGATIVE
Spec Grav, UA: 1.01
Urobilinogen, UA: 0.2
pH, UA: 6.5

## 2016-02-18 MED ORDER — PHENAZOPYRIDINE HCL 200 MG PO TABS: 200.0000 mg | ORAL_TABLET | Freq: Three times a day (TID) | ORAL | Status: AC

## 2016-02-18 MED ORDER — CEPHALEXIN 500 MG PO CAPS: 500.0000 mg | ORAL_CAPSULE | Freq: Two times a day (BID) | ORAL | Status: DC

## 2016-02-18 MED ORDER — NORGESTIM-ETH ESTRAD TRIPHASIC 0.18/0.215/0.25 MG-25 MCG PO TABS
1.0000 | ORAL_TABLET | Freq: Every day | ORAL | Status: DC
Start: 2016-02-18 — End: 2016-12-23

## 2016-02-18 NOTE — Progress Notes (Signed)
  Subjective:    CC: UTI  HPI: This is a pleasant 35 year old female, for the past several days she's had increasing urgency, frequency and burning with urination. No constitutional symptoms, no back pain, no rash.  Past medical history, Surgical history, Family history not pertinant except as noted below, Social history, Allergies, and medications have been entered into the medical record, reviewed, and no changes needed.   Review of Systems: No fevers, chills, night sweats, weight loss, chest pain, or shortness of breath.   Objective:    General: Well Developed, well nourished, and in no acute distress.  Neuro: Alert and oriented x3, extra-ocular muscles intact, sensation grossly intact.  HEENT: Normocephalic, atraumatic, pupils equal round reactive to light, neck supple, no masses, no lymphadenopathy, thyroid nonpalpable.  Skin: Warm and dry, no rashes. Cardiac: Regular rate and rhythm, no murmurs rubs or gallops, no lower extremity edema.  Respiratory: Clear to auscultation bilaterally. Not using accessory muscles, speaking in full sentences. Abdomen: Soft, nontender, nondistended, normal bowel sounds, no palpable masses, no guarding, rigidity, rebound tenderness.  Urinalysis is positive for leukocytes and blood.  Impression and Recommendations:

## 2016-02-18 NOTE — Assessment & Plan Note (Signed)
Keflex, Pyridium, urine culture. Return as needed.

## 2016-02-18 NOTE — Assessment & Plan Note (Signed)
Switching to Ortho Tri-Cyclen low per patient request

## 2016-02-20 LAB — URINE CULTURE: Colony Count: 9000

## 2016-03-03 ENCOUNTER — Telehealth: Payer: Self-pay | Admitting: *Deleted

## 2016-03-03 NOTE — Telephone Encounter (Signed)
Left message on patient's vm that contrave was denied through insurance. Asked to f/u with PCP to discuss further weight management options

## 2016-03-22 ENCOUNTER — Ambulatory Visit: Payer: BC Managed Care – PPO | Admitting: Family Medicine

## 2016-04-05 ENCOUNTER — Ambulatory Visit: Payer: BC Managed Care – PPO | Admitting: Family Medicine

## 2016-05-13 ENCOUNTER — Encounter: Payer: Self-pay | Admitting: Family Medicine

## 2016-05-13 ENCOUNTER — Ambulatory Visit (INDEPENDENT_AMBULATORY_CARE_PROVIDER_SITE_OTHER): Payer: BC Managed Care – PPO | Admitting: Family Medicine

## 2016-05-13 VITALS — BP 117/74 | HR 56 | Wt 199.0 lb

## 2016-05-13 DIAGNOSIS — R5383 Other fatigue: Secondary | ICD-10-CM

## 2016-05-13 DIAGNOSIS — F411 Generalized anxiety disorder: Secondary | ICD-10-CM

## 2016-05-13 MED ORDER — ALPRAZOLAM 0.5 MG PO TABS: 0.5000 mg | ORAL_TABLET | Freq: Every evening | ORAL | 0 refills | Status: DC | PRN

## 2016-05-13 NOTE — Progress Notes (Signed)
Subjective:    CC: Fatigue, new problem.   HPI: Fatigue -  Patient comes in complaining that she's felt fatigued for couple weeks now. She denies any specific or known causes. She actually says she sleeping well which sometimes she struggles with. She's not had any recent sicknesses or illnesses. No fevers chills or night sweats. No swollen lumps etc. She doesn't snore. No family history of thyroid disease. She denies any excessive or heavy. She says they're pretty regular. She does not have any special diet such as being vegetarian. She's never had a prior history of iron deficiency anemia. She has had mono before. He denies any swollen lymph nodes.    GAD - wants refill on her xanx she uses PRN.   Is taking her Zoloft without any side effects or problems. She does feel nervous and anxious several days of the week but doesn't feel that it's excessive and she denies any excess irritability.  Past medical history, Surgical history, Family history not pertinant except as noted below, Social history, Allergies, and medications have been entered into the medical record, reviewed, and corrections made.   Review of Systems: No fevers, chills, night sweats, weight loss, chest pain, or shortness of breath.   Objective:    General: Well Developed, well nourished, and in no acute distress.  Neuro: Alert and oriented x3, extra-ocular muscles intact, sensation grossly intact.  HEENT: Normocephalic, atraumatic, oropharynx is clear, extraocular movements intact, pupils equal round and reactive. Tympanic membranes and ear canals are clear bilaterally. No cervical lymphadenopathy.  Skin: Warm and dry, no rashes. Cardiac: Regular rate and rhythm, no murmurs rubs or gallops, no lower extremity edema.  Respiratory: Clear to auscultation bilaterally. Not using accessory muscles, speaking in full sentences.   Impression and Recommendations:   Fatigue-we'll do some additional blood work to evaluate for anemia as  well as thyroid disorder. Will call with results once available. If they are all negative and encouraged her to keep an eye on it over the next couple of weeks and see if it seems resolve on its own.  Generalized anxiety disorder-GAD 7 score of 2 today. Symptoms currently well controlled. Will refill her alprazolam today.

## 2016-05-14 LAB — COMPLETE METABOLIC PANEL WITH GFR
ALT: 16 U/L (ref 6–29)
AST: 23 U/L (ref 10–30)
Albumin: 4.2 g/dL (ref 3.6–5.1)
Alkaline Phosphatase: 40 U/L (ref 33–115)
BUN: 11 mg/dL (ref 7–25)
CO2: 24 mmol/L (ref 20–31)
Calcium: 9.4 mg/dL (ref 8.6–10.2)
Chloride: 104 mmol/L (ref 98–110)
Creat: 0.81 mg/dL (ref 0.50–1.10)
GFR, Est African American: >89 mL/min (ref >=60)
GFR, Est Non African American: >89 mL/min (ref >=60)
Glucose, Bld: 78 mg/dL (ref 65–99)
Potassium: 4.5 mmol/L (ref 3.5–5.3)
Sodium: 137 mmol/L (ref 135–146)
Total Bilirubin: 0.7 mg/dL (ref 0.2–1.2)
Total Protein: 7.2 g/dL (ref 6.1–8.1)

## 2016-05-14 LAB — CBC
HCT: 38.9 % (ref 35.0–45.0)
Hemoglobin: 12.6 g/dL (ref 11.7–15.5)
MCH: 28.4 pg (ref 27.0–33.0)
MCHC: 32.4 g/dL (ref 32.0–36.0)
MCV: 87.6 fL (ref 80.0–100.0)
MPV: 9.2 fL (ref 7.5–12.5)
Platelets: 273 10*3/uL (ref 140–400)
RBC: 4.44 MIL/uL (ref 3.80–5.10)
RDW: 13.3 % (ref 11.0–15.0)
WBC: 6.3 10*3/uL (ref 3.8–10.8)

## 2016-05-14 LAB — VITAMIN B12: Vitamin B-12: 337 pg/mL (ref 200–1100)

## 2016-05-14 LAB — TSH: TSH: 1.69 mIU/L

## 2016-05-14 LAB — C-REACTIVE PROTEIN: CRP: <0.5 mg/dL (ref <0.60)

## 2016-05-14 LAB — FERRITIN: Ferritin: 82 ng/mL (ref 10–154)

## 2016-05-14 LAB — SEDIMENTATION RATE: Sed Rate: 4 mm/hr (ref 0–20)

## 2016-05-14 LAB — VITAMIN D 25 HYDROXY (VIT D DEFICIENCY, FRACTURES): Vit D, 25-Hydroxy: 41 ng/mL (ref 30–100)

## 2016-08-08 ENCOUNTER — Encounter: Payer: Self-pay | Admitting: Family Medicine

## 2016-08-08 ENCOUNTER — Ambulatory Visit (INDEPENDENT_AMBULATORY_CARE_PROVIDER_SITE_OTHER): Payer: BC Managed Care – PPO | Admitting: Family Medicine

## 2016-08-08 ENCOUNTER — Ambulatory Visit (INDEPENDENT_AMBULATORY_CARE_PROVIDER_SITE_OTHER): Payer: BC Managed Care – PPO

## 2016-08-08 VITALS — BP 118/74 | HR 52 | Wt 205.0 lb

## 2016-08-08 DIAGNOSIS — M79671 Pain in right foot: Secondary | ICD-10-CM | POA: Diagnosis not present

## 2016-08-08 NOTE — Patient Instructions (Signed)
Thank you for coming in today. Return if not better,.  Give your foot a rest.  Use voltaren gel for pain as needed.  When feeling better restart running using a couch to 5k program.

## 2016-08-09 NOTE — Progress Notes (Signed)
Kiara PlantsKirsten [name] is a 35 y.o. female who presents to [facility] today for right foot pain. Patient is a 2 day history of right dorsal midfoot pain. She denies any injury. Pain started after running. Patient suffered a left foot injury several months ago and recently restarted running. She notes she has been increasing her intensity over the last week. No fevers or chills nausea vomiting or diarrhea. No treatment tried yet.   No past medical history on file. Past Surgical History:  Procedure Laterality Date  . RHINOPLASTY  2007, 2009   Social History  Substance Use Topics  . Smoking status: Former Smoker    Types: Cigarettes    Quit date: 10/17/2012  . Smokeless tobacco: Never Used  . Alcohol use Yes     ROS:  As above   Medications: Current Outpatient Prescriptions  Medication Sig Dispense Refill  . ALPRAZolam (XANAX) 0.5 MG tablet Take 1 tablet (0.5 mg total) by mouth at bedtime as needed for anxiety or sleep. 30 tablet 0  . Norgestimate-Ethinyl Estradiol Triphasic 0.18/0.215/0.25 MG-25 MCG tab Take 1 tablet by mouth daily. 1 Package 11  . sertraline (ZOLOFT) 50 MG tablet Take 1 tablet (50 mg total) by mouth daily. 90 tablet 2   No current facility-administered medications for this visit.    Allergies  Allergen Reactions  . Citalopram Palpitations    tachycardia tachycardia     Exam:  BP 118/74   Pulse (!) 52   Wt 205 lb (93 kg)   BMI 29.41 kg/m  General: Well Developed, well nourished, and in no acute distress.  Neuro/Psych: Alert and oriented x3, extra-ocular muscles intact, able to move all 4 extremities, sensation grossly intact. Skin: Warm and dry, no rashes noted.  Respiratory: Not using accessory muscles, speaking in full sentences, trachea midline.  Cardiovascular: Pulses palpable, no extremity edema. Abdomen: Does not appear distended. MSK: Right foot slight bossing at the medial tarsometatarsal joints. Tender  to palpation at the first through third tarsometatarsal joints. Normal foot motion. Pulses capillary refill and sensation are intact.    No results found for this or any previous visit (from the past 48 hour(s)). Dg Foot Complete Right  Result Date: 08/08/2016 CLINICAL DATA:  Right foot pain after running yesterday. EXAM: RIGHT FOOT COMPLETE - 3+ VIEW COMPARISON:  None. FINDINGS: There is no evidence of fracture or dislocation. There is no evidence of arthropathy or other focal bone abnormality. Soft tissues are unremarkable. IMPRESSION: Normal right foot. Electronically Signed   By: Lupita RaiderJames  Green Jr, M.D.   On: 08/08/2016 16:20      Assessment and Plan: 35 y.o. female with right foot pain likely due to overuse with increase activity. I'm doubtful for stress fracture that she's only been hurting for 2 days. Plan for a bit of foot rest and resumption of running as tolerated using a 5K program. Recommend orthotics as well as the near future.    Orders Placed This Encounter  Procedures  . DG Foot Complete Right    Standing Status:   Future    Number of Occurrences:   1    Standing Expiration Date:   10/08/2017    Order Specific Question:   Reason for Exam (SYMPTOM  OR DIAGNOSIS REQUIRED)    Answer:   eval pain at dorsal rt foot    Order Specific Question:   Is patient pregnant?    Answer:   No    Order Specific Question:  Preferred imaging location?    Answer:   [facility]    Discussed warning signs or symptoms. Please see discharge instructions. Patient expresses understanding.

## 2016-09-26 ENCOUNTER — Ambulatory Visit (INDEPENDENT_AMBULATORY_CARE_PROVIDER_SITE_OTHER): Payer: BC Managed Care – PPO | Admitting: Family Medicine

## 2016-09-26 ENCOUNTER — Encounter: Payer: Self-pay | Admitting: Family Medicine

## 2016-09-26 VITALS — BP 113/67 | HR 67 | Ht 70.0 in | Wt 207.0 lb

## 2016-09-26 DIAGNOSIS — R300 Dysuria: Secondary | ICD-10-CM | POA: Diagnosis not present

## 2016-09-26 DIAGNOSIS — N3000 Acute cystitis without hematuria: Secondary | ICD-10-CM | POA: Diagnosis not present

## 2016-09-26 LAB — POCT URINALYSIS DIPSTICK
Bilirubin, UA: NEGATIVE
Blood, UA: NEGATIVE
Glucose, UA: NEGATIVE
Ketones, UA: NEGATIVE
Leukocytes, UA: NEGATIVE
Nitrite, UA: NEGATIVE
Protein, UA: NEGATIVE
Spec Grav, UA: 1.015
Urobilinogen, UA: 0.2
pH, UA: 8

## 2016-09-26 MED ORDER — CIPROFLOXACIN HCL 500 MG PO TABS: 500.0000 mg | ORAL_TABLET | Freq: Two times a day (BID) | ORAL | 0 refills | Status: AC

## 2016-09-26 NOTE — Addendum Note (Signed)
Addended by: Deno EtienneBARKLEY, TONYA L on: 09/26/2016 02:11 PM   Modules accepted: Orders

## 2016-09-26 NOTE — Progress Notes (Signed)
   Subjective:    Patient ID: Kiara [name], female    DOB: 1980/12/05, 35 y.o.   MRN: [id number]  HPI Patient comes in with take 2 days of dysuria and frequency. She denies any fever back pain or rash. She denies any blood in the urine. She has been trying to drink lots of cranberry juice.   Review of Systems     Objective:   Physical Exam  Constitutional: She is oriented to person, place, and time. She appears well-developed and well-nourished.  HENT:  Head: Normocephalic and atraumatic.  Eyes: Conjunctivae and EOM are normal.  Cardiovascular: Normal rate.   Pulmonary/Chest: Effort normal.  Neurological: She is alert and oriented to person, place, and time.  Skin: Skin is dry. No pallor.  Psychiatric: She has a normal mood and affect. Her behavior is normal.  Vitals reviewed.         Assessment & Plan:  Urinary tract infection-we'll treat with Cipro 5 mg 3 days. Urine dipstick was actually negative so we'll send for culture for confirmation. If she's not been significantly better after 3 days and please give us a call. Reviewed preventative hygiene including drink plenty of water, imaging the bladder regularly when she has the urge and not holding it, wearing cotton underwear etc.

## 2016-09-26 NOTE — Patient Instructions (Addendum)
Urinary Tract Infection, Adult Introduction A urinary tract infection (UTI) is an infection of any part of the urinary tract. The urinary tract includes the:  Kidneys.  Ureters.  Bladder.  Urethra. These organs make, store, and get rid of pee (urine) in the body. Follow these instructions at home:  Take over-the-counter and prescription medicines only as told by your doctor.  If you were prescribed an antibiotic medicine, take it as told by your doctor. Do not stop taking the antibiotic even if you start to feel better.  Avoid the following drinks:  Alcohol.  Caffeine.  Tea.  Carbonated drinks.  Drink enough fluid to keep your pee clear or pale yellow.  Keep all follow-up visits as told by your doctor. This is important.  Make sure to:  Empty your bladder often and completely. Do not to hold pee for long periods of time.  Empty your bladder before and after sex.  Wipe from front to back after a bowel movement if you are female. Use each tissue one time when you wipe. Contact a doctor if:  You have back pain.  You have a fever.  You feel sick to your stomach (nauseous).  You throw up (vomit).  Your symptoms do not get better after 3 days.  Your symptoms go away and then come back. Get help right away if:  You have very bad back pain.  You have very bad lower belly (abdominal) pain.  You are throwing up and cannot keep down any medicines or water. This information is not intended to replace advice given to you by your health care provider. Make sure you discuss any questions you have with your health care provider. Document Released: 03/21/2008 Document Revised: 03/10/2016 Document Reviewed: 08/24/2015  2017 Elsevier  

## 2016-09-28 LAB — URINE CULTURE: Organism ID, Bacteria: NO GROWTH

## 2016-10-23 ENCOUNTER — Other Ambulatory Visit: Payer: Self-pay | Admitting: Family Medicine

## 2016-10-25 ENCOUNTER — Encounter: Payer: Self-pay | Admitting: Family Medicine

## 2016-10-25 ENCOUNTER — Ambulatory Visit (INDEPENDENT_AMBULATORY_CARE_PROVIDER_SITE_OTHER): Payer: BC Managed Care – PPO | Admitting: Family Medicine

## 2016-10-25 VITALS — BP 105/57 | HR 65 | Ht 70.0 in | Wt 203.0 lb

## 2016-10-25 DIAGNOSIS — F411 Generalized anxiety disorder: Secondary | ICD-10-CM

## 2016-10-25 MED ORDER — SERTRALINE HCL 50 MG PO TABS: 50.0000 mg | ORAL_TABLET | Freq: Every day | ORAL | 3 refills | Status: DC

## 2016-10-25 MED ORDER — ALPRAZOLAM 0.5 MG PO TABS: 0.5000 mg | ORAL_TABLET | Freq: Every evening | ORAL | 1 refills | Status: DC | PRN

## 2016-10-25 NOTE — Progress Notes (Signed)
Subjective:    CC: 6 mo f/U GAD   HPI:  GAD - Overall she is doing really well. She says that the Zoloft has been a life change her. She said in fact she didn't realize how bad her anxiety was until now she's been fully treated. Her insomnia is completely resolved. She does still use her alprazolam occasionally but says it's very rarely. She does need refills on both medications.  She and her husband actually split about 2 years ago and she is now dating someone new. She's also now working at [employer].  BP (!) 105/57   Pulse 65   Ht 5\' 10"  (1.778 m)   Wt 203 lb (92.1 kg)   SpO2 100%   BMI 29.13 kg/m     Allergies  Allergen Reactions  . Citalopram Palpitations    tachycardia tachycardia    History reviewed. No pertinent past medical history.  Past Surgical History:  Procedure Laterality Date  . RHINOPLASTY  2007, 2009    Social History   Social History  . Marital status: Divorced    Spouse name: Jeannett SeniorStephen  . Number of children: N/A  . Years of education: Masters   Occupational History  . Licensed professional counselor     [employer]   Social History Main Topics  . Smoking status: Former Smoker    Types: Cigarettes    Quit date: 10/17/2012  . Smokeless tobacco: Never Used  . Alcohol use Yes  . Drug use: No  . Sexual activity: Yes   Other Topics Concern  . Not on file   Social History Narrative   No regular exercise.    Family History  Problem Relation Age of Onset  . Breast cancer Maternal Grandmother   . Hyperlipidemia Father   . Hypertension Father     Outpatient Encounter Prescriptions as of 10/25/2016  Medication Sig  . ALPRAZolam (XANAX) 0.5 MG tablet Take 1 tablet (0.5 mg total) by mouth at bedtime as needed for anxiety or sleep.  . Norgestimate-Ethinyl Estradiol Triphasic 0.18/0.215/0.25 MG-25 MCG tab Take 1 tablet by mouth daily.  . sertraline (ZOLOFT) 50 MG tablet Take 1 tablet (50 mg total) by mouth daily.  . [DISCONTINUED] ALPRAZolam  (XANAX) 0.5 MG tablet Take 1 tablet (0.5 mg total) by mouth at bedtime as needed for anxiety or sleep.  . [DISCONTINUED] sertraline (ZOLOFT) 50 MG tablet Take 1 tablet (50 mg total) by mouth daily. Due for f/u appointment   No facility-administered encounter medications on file as of 10/25/2016.         Review of Systems: No fevers, chills, night sweats, weight loss, chest pain, or shortness of breath.   Objective:    General: Well Developed, well nourished, and in no acute distress.  Neuro: Alert and oriented x3, extra-ocular muscles intact, sensation grossly intact.  HEENT: Normocephalic, atraumatic  Skin: Warm and dry, no rashes. Cardiac: Regular rate and rhythm, no murmurs rubs or gallops, no lower extremity edema.  Respiratory: Clear to auscultation bilaterally. Not using accessory muscles, speaking in full sentences.   Impression and Recommendations:   GAD - GAD - 7 score of 2 today. Well controlled. Discussed option of discontinuing medication versus continuing.Marland Kitchen. She wants to continue it for now. Follow-up in 6-12 months. Refilled alprazolam as well. Encouraged her to start exercising regularly.  Time spent 15 min, > 50% spent counseling about anxiety.

## 2016-12-23 ENCOUNTER — Other Ambulatory Visit: Payer: Self-pay | Admitting: Sports Medicine

## 2016-12-23 DIAGNOSIS — Z3041 Encounter for surveillance of contraceptive pills: Secondary | ICD-10-CM

## 2016-12-23 NOTE — Telephone Encounter (Signed)
To PCP

## 2017-01-02 DIAGNOSIS — R928 Other abnormal and inconclusive findings on diagnostic imaging of breast: Secondary | ICD-10-CM | POA: Insufficient documentation

## 2017-02-23 ENCOUNTER — Encounter: Payer: Self-pay | Admitting: Family Medicine

## 2017-02-23 ENCOUNTER — Ambulatory Visit (INDEPENDENT_AMBULATORY_CARE_PROVIDER_SITE_OTHER): Payer: BC Managed Care – PPO | Admitting: Family Medicine

## 2017-02-23 VITALS — BP 116/76 | HR 85 | Ht 70.0 in | Wt 204.0 lb

## 2017-02-23 DIAGNOSIS — H1032 Unspecified acute conjunctivitis, left eye: Secondary | ICD-10-CM

## 2017-02-23 MED ORDER — POLYMYXIN B-TRIMETHOPRIM 10000-0.1 UNIT/ML-% OP SOLN: 2.0000 [drp] | Freq: Four times a day (QID) | OPHTHALMIC | 0 refills | Status: DC

## 2017-02-23 NOTE — Progress Notes (Signed)
   Subjective:    Patient ID: Kiara [name], female    DOB: 1981-03-21, 36 y.o.   MRN: [id number]  HPI Swollen left eye x 3 days. Has been using some eye drops. Says thinks it may have poked it wither her make-up brush.  She says it feels like there is something in it.  She has been using some cool compresses on it. She's noticed that the inner upper lid is just a little bit red and swollen. She denies any vision change. She's not sure if it could be allergy related as well.  Review of Systems     Objective:   Physical Exam  Constitutional: She is oriented to person, place, and time. She appears well-developed and well-nourished.  HENT:  Head: Normocephalic and atraumatic.  Eyes: Conjunctivae and EOM are normal. Right eye exhibits no exudate. Left eye exhibits no exudate. Left conjunctiva has no hemorrhage. Left eye exhibits normal extraocular motion and no nystagmus. Left pupil is round and reactive. Pupils are equal.    I did put tetracaine drop in eye to numb it as well as fluorescent stain. Did not see any excoriations with the black light. I did not see any debris in the eye.  Tetracaine drop by [employer]. Lot #[id number]. Expiration November 2018.  Cardiovascular: Normal rate.   Pulmonary/Chest: Effort normal.  Neurological: She is alert and oriented to person, place, and time.  Skin: Skin is dry. No pallor.  Psychiatric: She has a normal mood and affect. Her behavior is normal.  Vitals reviewed.         Assessment & Plan:  Conjunctivitis Versus mild blepharitis-we'll treat with trimethoprim/polymyxin B drops. Recommend warm compresses. If not improving over the next week and please let me know.

## 2017-03-16 IMAGING — DX DG FOOT COMPLETE 3+V*L*
3 series · 3 of 3 positions shown · non-contrast
Comparison: 01/07/2016.  11/02/2015.

CLINICAL DATA: Stress fracture.  Recurrent injury.

EXAM:
LEFT FOOT - COMPLETE 3+ VIEW

[foot ap]
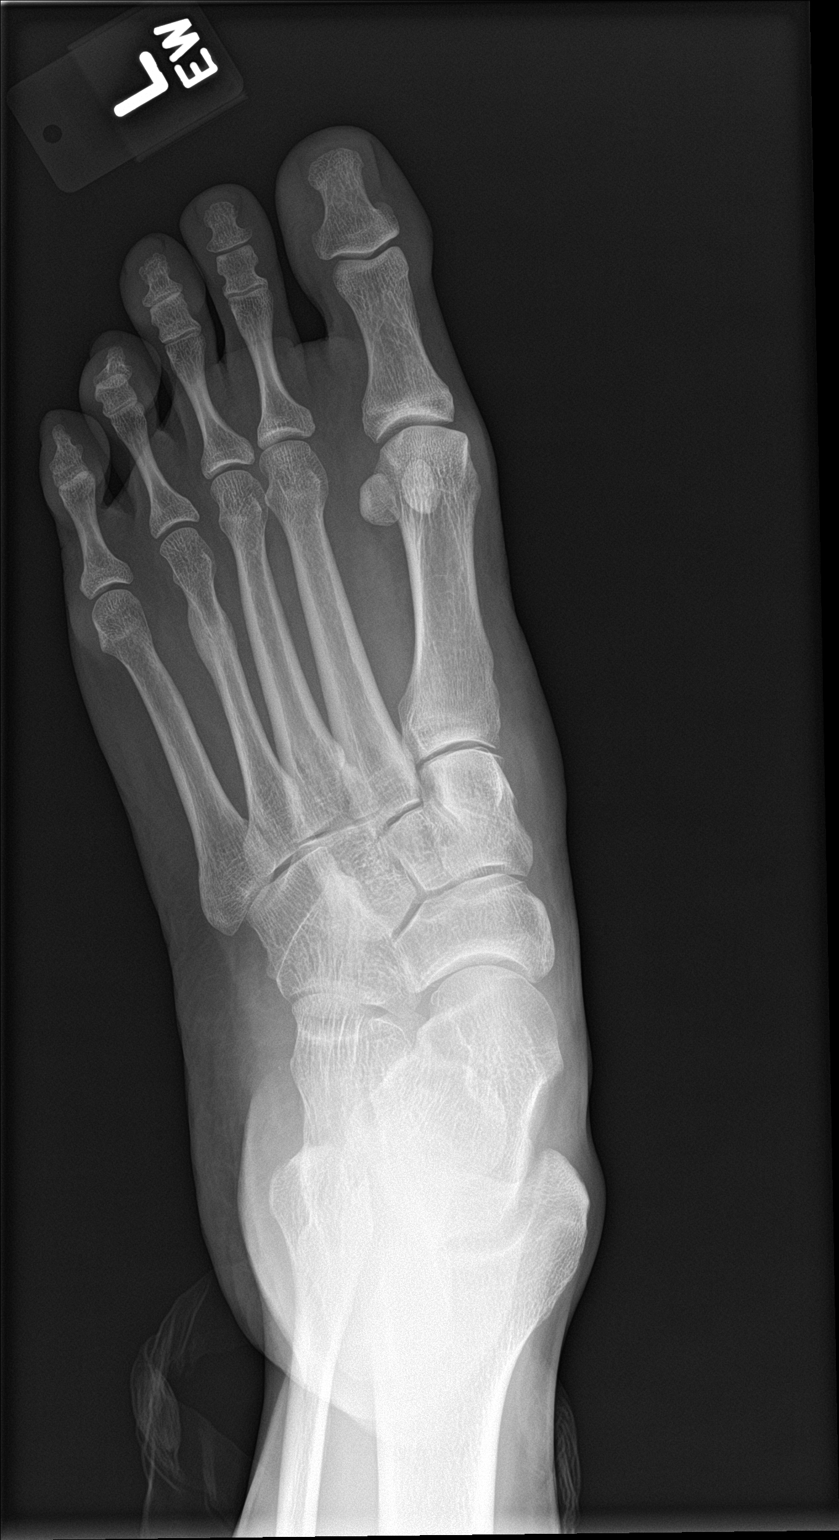

[foot obl]
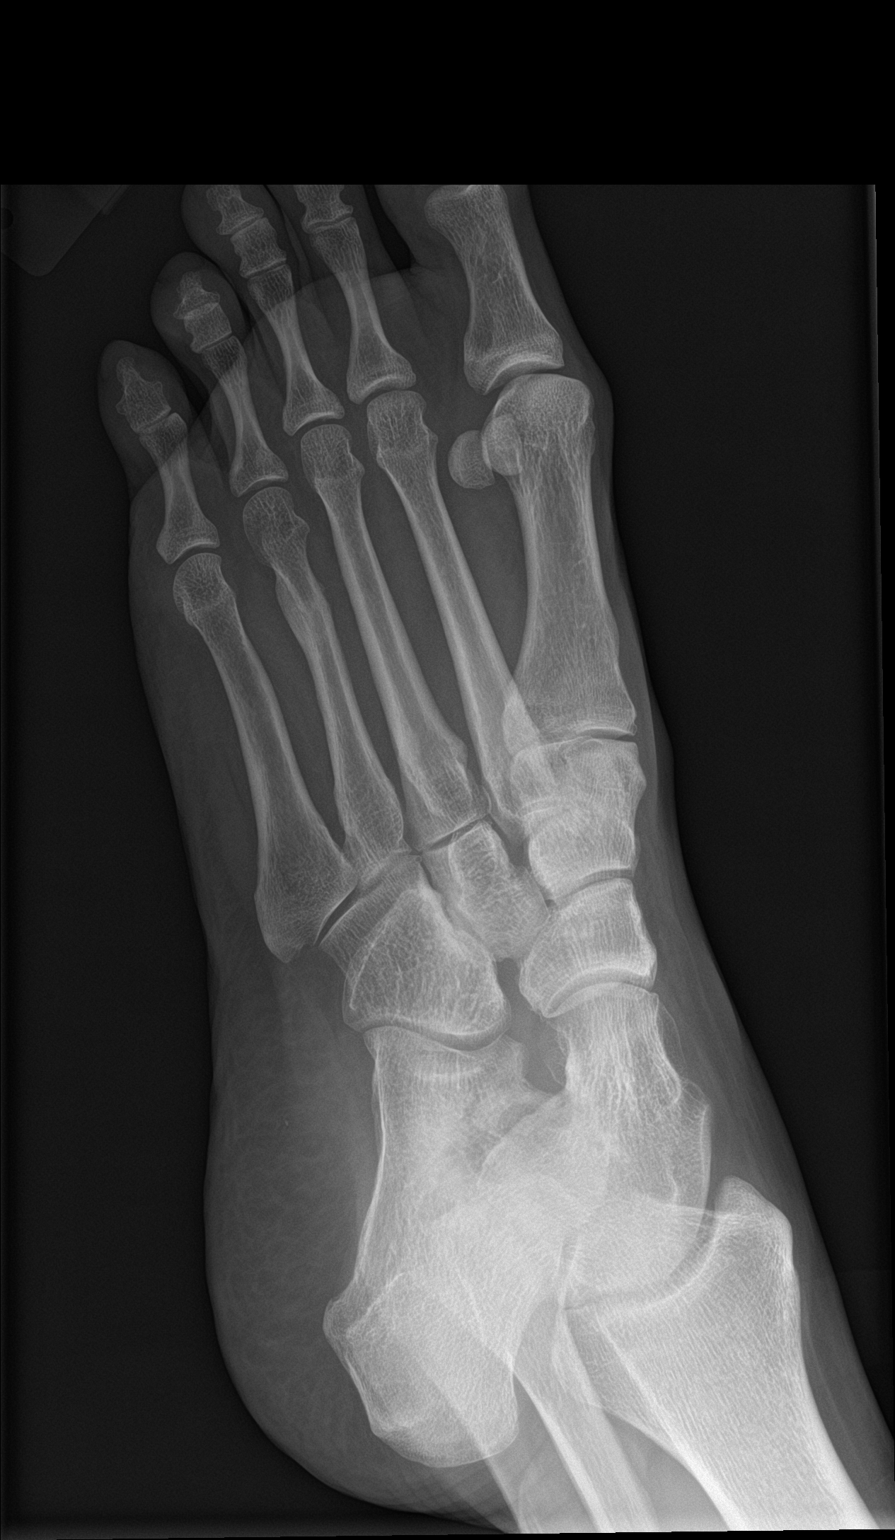

[foot lat]
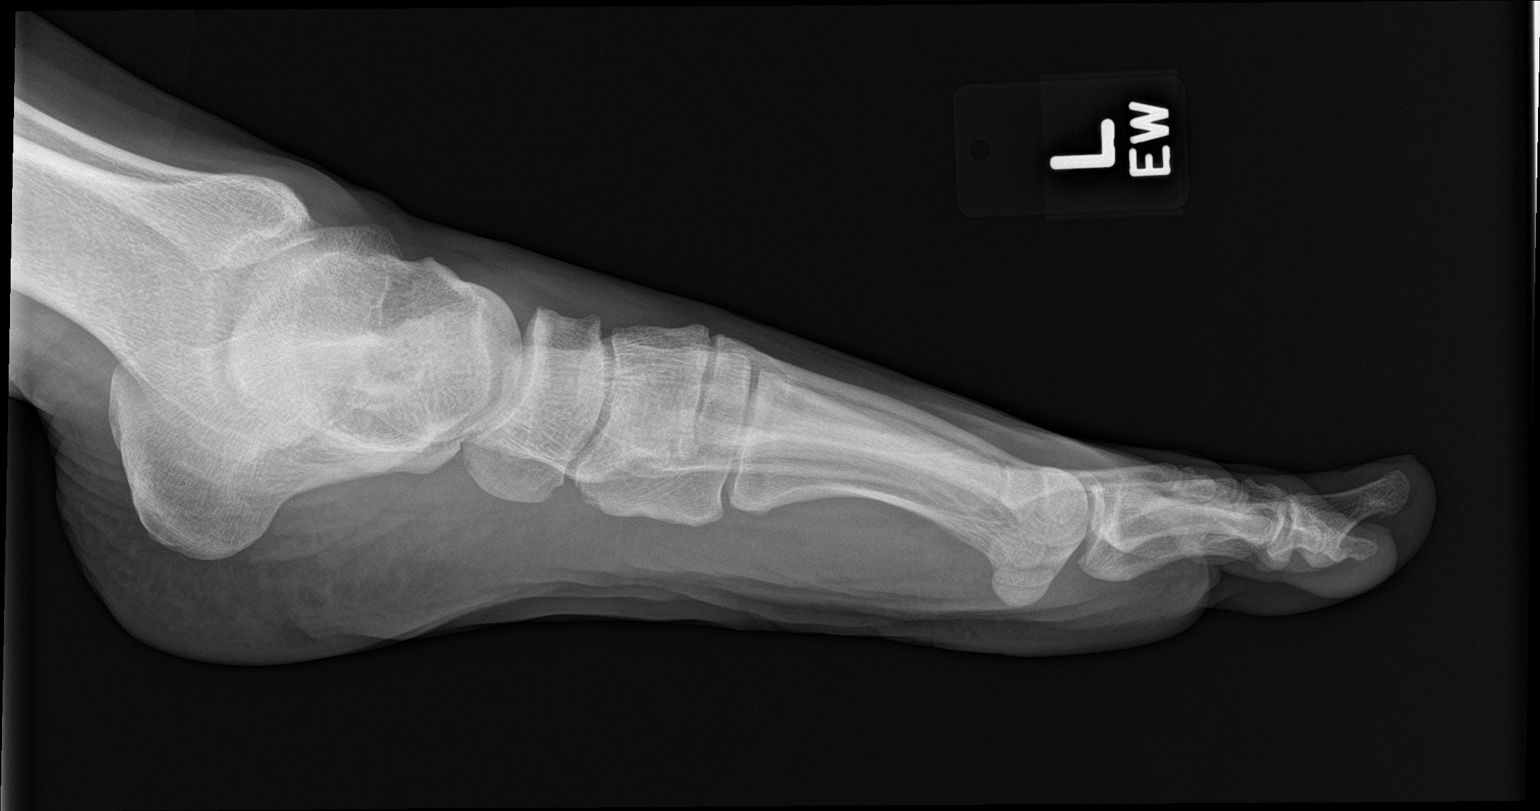

[3 of 3 positions shown; findings below may reference images not displayed]

FINDINGS: Continued healing of distal left fourth metatarsal stress fracture.
Fracture lucency has almost resolved. No new fractures are
identified. No other focal abnormality identified.
IMPRESSION: Continued healing and distal left fourth metatarsal stress fracture.
Fracture lucency has almost resolved. No acute or focal new
abnormality identified.

## 2017-05-25 ENCOUNTER — Ambulatory Visit (INDEPENDENT_AMBULATORY_CARE_PROVIDER_SITE_OTHER): Payer: BC Managed Care – PPO | Admitting: Family Medicine

## 2017-05-25 ENCOUNTER — Encounter: Payer: Self-pay | Admitting: Family Medicine

## 2017-05-25 VITALS — BP 120/67 | HR 68 | Temp 98.4°F | Wt 204.0 lb

## 2017-05-25 DIAGNOSIS — R3911 Hesitancy of micturition: Secondary | ICD-10-CM | POA: Diagnosis not present

## 2017-05-25 LAB — POCT URINALYSIS DIPSTICK
Bilirubin, UA: NEGATIVE
Blood, UA: NEGATIVE
Glucose, UA: NEGATIVE
Ketones, UA: NEGATIVE
Leukocytes, UA: NEGATIVE
Nitrite, UA: NEGATIVE
Protein, UA: NEGATIVE
Spec Grav, UA: <=1.005 — AB (ref 1.010–1.025)
Urobilinogen, UA: 0.2 E.U./dL
pH, UA: 6 (ref 5.0–8.0)

## 2017-05-25 LAB — POCT URINE PREGNANCY: Preg Test, Ur: NEGATIVE

## 2017-05-25 NOTE — Addendum Note (Signed)
Addended by: Chalmers CaterUTTLE, ANGELA H on: 05/25/2017 04:56 PM   Modules accepted: Orders

## 2017-05-25 NOTE — Progress Notes (Signed)
   Subjective:    Patient ID: Kiara [name], female    DOB: 1981-02-02, 36 y.o.   MRN: [id number]  HPI 4 days of feeling like she can't empty her urine. No fever, chills or sweats.   No blood in the urine. Says she had some lower pelvic pain the first couple of days but that is better. No blood in urine. No abnormal vaginal discharge. No low back pain. No worsening or alleviating factors. She says she actually does feel a lot better today. The symptoms not completely resolved. She has not noticed any decrease in the force of her urinary stream. No trickling of the urine. She says just when she goes she feels like she's not completely emptying and feels a lot of pressure and most like when her bladder is very full. Reports that she is having normal bowel movements no constipation. She's currently on birth controls it does not think that she's pregnant. She has been taking Azo-Standard.    Review of Systems     Objective:   Physical Exam  Constitutional: She is oriented to person, place, and time. She appears well-developed and well-nourished.  HENT:  Head: Normocephalic and atraumatic.  Cardiovascular: Normal rate, regular rhythm and normal heart sounds.   Pulmonary/Chest: Effort normal and breath sounds normal.  Abdominal: Soft. Bowel sounds are normal. She exhibits no distension and no mass. There is no tenderness. There is no rebound and no guarding.  Neurological: She is alert and oriented to person, place, and time.  Skin: Skin is warm and dry.  Psychiatric: She has a normal mood and affect. Her behavior is normal.         Assessment & Plan:  Urinary hesitancy - Unclear etiology. It doesn't sound consistent with a urethral stricture. Urinalysis was negative for any type of urinary tract infection. I will send culture for confirmation. She denies any other symptoms such as constipation which can be causing some issues as well. Overall she actually is feeling better so the culture  comes back negative and she is completely better at that time and no further workup needed. The culture comes back and she is still symptomatic then consider pelvic ultrasound for further evaluation to look for lateral mass, uterine fibroid, ovarian lesion etc.

## 2017-05-26 ENCOUNTER — Telehealth: Payer: Self-pay

## 2017-05-26 MED ORDER — SULFAMETHOXAZOLE-TRIMETHOPRIM 800-160 MG PO TABS: 1.0000 | ORAL_TABLET | Freq: Two times a day (BID) | ORAL | 0 refills | Status: DC

## 2017-05-26 NOTE — Telephone Encounter (Signed)
Okay, [facility]

## 2017-05-26 NOTE — Telephone Encounter (Signed)
Patient has been informed. Rhonda Cunningham,CMA  

## 2017-05-26 NOTE — Telephone Encounter (Signed)
Patient called stated that she was seen for UTI symptoms and that her symptoms have gotten worse and there is a smell with her urine. Patient is requesting an antibiotic sent to her pharmacy. Please advise. Rhonda Cunningham,CMA

## 2017-05-28 LAB — URINE CULTURE

## 2017-11-16 ENCOUNTER — Other Ambulatory Visit: Payer: Self-pay | Admitting: Family Medicine

## 2017-12-04 ENCOUNTER — Ambulatory Visit (INDEPENDENT_AMBULATORY_CARE_PROVIDER_SITE_OTHER): Payer: BC Managed Care – PPO | Admitting: Family Medicine

## 2017-12-04 ENCOUNTER — Encounter: Payer: Self-pay | Admitting: Family Medicine

## 2017-12-04 VITALS — BP 137/74 | HR 49 | Ht 70.0 in | Wt 197.0 lb

## 2017-12-04 DIAGNOSIS — Z13220 Encounter for screening for lipoid disorders: Secondary | ICD-10-CM

## 2017-12-04 DIAGNOSIS — Z79899 Other long term (current) drug therapy: Secondary | ICD-10-CM

## 2017-12-04 DIAGNOSIS — F411 Generalized anxiety disorder: Secondary | ICD-10-CM | POA: Diagnosis not present

## 2017-12-04 DIAGNOSIS — Z3041 Encounter for surveillance of contraceptive pills: Secondary | ICD-10-CM | POA: Diagnosis not present

## 2017-12-04 MED ORDER — SERTRALINE HCL 50 MG PO TABS: 50.0000 mg | ORAL_TABLET | Freq: Every day | ORAL | 3 refills | Status: DC

## 2017-12-04 MED ORDER — ALPRAZOLAM 0.5 MG PO TABS: 0.5000 mg | ORAL_TABLET | Freq: Every evening | ORAL | 1 refills | Status: DC | PRN

## 2017-12-04 MED ORDER — NORGESTIM-ETH ESTRAD TRIPHASIC 0.18/0.215/0.25 MG-25 MCG PO TABS: 1.0000 | ORAL_TABLET | Freq: Every day | ORAL | 3 refills | Status: DC

## 2017-12-04 NOTE — Progress Notes (Signed)
   Subjective:    Patient ID: Kiara [name], female    DOB: 20-Jan-1981, 37 y.o.   MRN: [id number]  HPI F/U anxiety and depression -overall she is doing really great.  She is happy with her 50 mg sertraline and occasional alprazolam.  She tries to use it very sparingly.  She recently got engaged and will be getting married soon.  She is hoping to have children.  She would like a refill on her birth control for now.  She still exercising regularly.  She would like to continue her birth control for now but will likely be trying to get pregnant later in the year after she gets married.  She would like the medication switch to a 90-day supply.   Review of Systems     Objective:   Physical Exam  Constitutional: She is oriented to person, place, and time. She appears well-developed and well-nourished.  HENT:  Head: Normocephalic and atraumatic.  Cardiovascular: Normal rate, regular rhythm and normal heart sounds.  Pulmonary/Chest: Effort normal and breath sounds normal.  Neurological: She is alert and oriented to person, place, and time.  Skin: Skin is warm and dry.  Psychiatric: She has a normal mood and affect. Her behavior is normal.          Assessment & Plan:  Anxiety/Depression -she is doing well overall.  We will continue current regimen and follow-up in 1 year.  Oral contraceptives-refill medication.  Due for CMP and screening lipid panel.

## 2017-12-25 ENCOUNTER — Encounter: Payer: Self-pay | Admitting: Sports Medicine

## 2017-12-25 ENCOUNTER — Ambulatory Visit: Payer: BC Managed Care – PPO | Admitting: Sports Medicine

## 2017-12-25 DIAGNOSIS — M545 Low back pain, unspecified: Secondary | ICD-10-CM | POA: Insufficient documentation

## 2017-12-25 MED ORDER — DEXAMETHASONE 4 MG PO TABS
4.0000 mg | ORAL_TABLET | Freq: Two times a day (BID) | ORAL | 0 refills | Status: DC
Start: 2017-12-25 — End: 2018-12-17

## 2017-12-25 MED ORDER — PREDNISONE 50 MG PO TABS: ORAL_TABLET | ORAL | 0 refills | Status: DC

## 2017-12-25 MED ORDER — KETOROLAC TROMETHAMINE 15.75 MG/SPRAY NA SOLN: 1.0000 | Freq: Four times a day (QID) | NASAL | 3 refills | Status: AC | PRN

## 2017-12-25 MED ORDER — KETOROLAC TROMETHAMINE 15.75 MG/SPRAY NA SOLN: 1.0000 | Freq: Four times a day (QID) | NASAL | 3 refills | Status: DC | PRN

## 2017-12-25 MED ORDER — KETOROLAC TROMETHAMINE 30 MG/ML IJ SOLN
30.0000 mg | Freq: Once | INTRAMUSCULAR | Status: AC
Start: 2017-12-25 — End: 2017-12-25
  Administered 2017-12-25: 30 mg via INTRAMUSCULAR

## 2017-12-25 MED ORDER — CIPROFLOXACIN HCL 500 MG PO TABS: 500.0000 mg | ORAL_TABLET | Freq: Two times a day (BID) | ORAL | 0 refills | Status: DC

## 2017-12-25 NOTE — Progress Notes (Signed)
Subjective:    I'm seeing this patient as a consultation for: Dr. Nani Gasser  CC: Low back pain  HPI: This is a pleasant 37 year old female, this week and she was doing a lot of vacuuming, felt a sharp pain in her low back, worse with sitting, flexion, Valsalva, nothing radicular, no bowel or bladder dysfunction, saddle numbness, constitutional symptoms, unfortunate the pain is persisted, she is getting married on Saturday and then flying out to [place] for her honeymoon and not surprisingly needs this to be gone.  I reviewed the past medical history, family history, social history, surgical history, and allergies today and no changes were needed.  Please see the problem list section below in epic for further details.  Past Medical History: No past medical history on file. Past Surgical History: Past Surgical History:  Procedure Laterality Date  . RHINOPLASTY  2007, 2009   Social History: Social History   Socioeconomic History  . Marital status: Divorced    Spouse name: None  . Number of children: None  . Years of education: Masters  . Highest education level: None  Social Needs  . Financial resource strain: None  . Food insecurity - worry: None  . Food insecurity - inability: None  . Transportation needs - medical: None  . Transportation needs - non-medical: None  Occupational History  . 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 Smoking status: Former Smoker    Types: Cigarettes    Last attempt to quit: 10/17/2012    Years since quitting: 5.1  . Smokeless tobacco: Never Used  Substance and Sexual Activity  . Alcohol use: Yes  . Drug use: No  . Sexual activity: Yes  Other Topics Concern  . None  Social History Narrative   No regular exercise.   Family History: Family History  Problem Relation Age of Onset  . Breast cancer Maternal Grandmother   . Hyperlipidemia Father   . Hypertension Father     Allergies: Allergies  Allergen Reactions  . Citalopram Palpitations    tachycardia tachycardia   Medications: See med rec.  Review of Systems: No headache, visual changes, nausea, vomiting, diarrhea, constipation, dizziness, abdominal pain, skin rash, fevers, chills, night sweats, weight loss, swollen lymph nodes, body aches, joint swelling, muscle aches, chest pain, shortness of breath, mood changes, visual or auditory hallucinations.   Objective:   General: Well Developed, well nourished, and in no acute distress.  Neuro:  Extra-ocular muscles intact, able to move all 4 extremities, sensation grossly intact.  Deep tendon reflexes tested were normal. Psych: Alert and oriented, mood congruent with affect. ENT:  Ears and nose appear unremarkable.  Hearing grossly normal. Neck: Unremarkable overall appearance, trachea midline.  No visible thyroid enlargement. Eyes: Conjunctivae and lids appear unremarkable.  Pupils equal and round. Skin: Warm and dry, no rashes noted.  Cardiovascular: Pulses palpable, no extremity edema. Back Exam:  Inspection: Unremarkable  Motion: Flexion 45 deg, Extension 45 deg, Side Bending to 45 deg bilaterally,  Rotation to 45 deg bilaterally  SLR laying: Negative  XSLR laying: Negative  Palpable tenderness: None. FABER: negative. Sensory change: Gross sensation intact to all lumbar and sacral dermatomes.  Reflexes: 2+ at both patellar tendons, 2+ at achilles tendons, Babinski's downgoing.  Strength at foot  Plantar-flexion: 5/5 Dorsi-flexion: 5/5 Eversion: 5/5 Inversion: 5/5  Leg strength  Quad: 5/5 Hamstring: 5/5 Hip flexor: 5/5 Hip abductors: 5/5  Gait unremarkable.  Impression and Recommendations:   This  case required medical decision making of moderate complexity.  Acute bilateral low back pain without sciatica Lumbar strain versus discogenic type back pain. No red flags. Toradol 30 intramuscular. Patient declines prednisone, using Decadron,  prescription for Sprix. Rehab exercises given, return to see me in 1 month as needed. She is going on a trip to [place] for her honeymoon, adding a prescription for Cipro to use if she develops any traveler's diarrhea. __________________________________________ Ihor Austinhomas J. Benjamin Stainhekkekandam, M.D., ABFM., CAQSM. Primary Care and Sports Medicine [facility]  Adjunct Instructor of Family Medicine  [employer]

## 2017-12-25 NOTE — Assessment & Plan Note (Signed)
Lumbar strain versus discogenic type back pain. No red flags. Toradol 30 intramuscular. Patient declines prednisone, using Decadron, prescription for Sprix. Rehab exercises given, return to see me in 1 month as needed. She is going on a trip to [place] for her honeymoon, adding a prescription for Cipro to use if she develops any traveler's diarrhea.

## 2018-04-25 ENCOUNTER — Ambulatory Visit: Payer: BC Managed Care – PPO | Admitting: Primary Care

## 2018-07-24 LAB — HM PAP SMEAR: HM Pap smear: NEGATIVE

## 2018-07-24 LAB — RESULTS CONSOLE HPV: CHL HPV: NEGATIVE

## 2018-10-25 LAB — TYPE AND SCREEN
Antibody Screen: NEGATIVE
HCT: 38
HGB: 13
MCV: 84 (ref 76–111)
Rh Type: POSITIVE
VDRL, Serum: NONREACTIVE

## 2018-12-10 ENCOUNTER — Other Ambulatory Visit: Payer: Self-pay | Admitting: Family Medicine

## 2018-12-12 ENCOUNTER — Telehealth: Payer: Self-pay

## 2018-12-12 MED ORDER — SERTRALINE HCL 50 MG PO TABS: 50.0000 mg | ORAL_TABLET | Freq: Every day | ORAL | 0 refills | Status: DC

## 2018-12-12 NOTE — Telephone Encounter (Signed)
Erlene called and left a message asking for a refill on Sertraline. She asked if she also need an appointment. I called and left a message advising patient to call to schedule an appointment.

## 2018-12-17 ENCOUNTER — Ambulatory Visit: Payer: BC Managed Care – PPO | Admitting: Family Medicine

## 2018-12-17 ENCOUNTER — Encounter: Payer: Self-pay | Admitting: Family Medicine

## 2018-12-17 VITALS — BP 123/64 | HR 87 | Ht 70.0 in | Wt 211.0 lb

## 2018-12-17 DIAGNOSIS — Z3A14 14 weeks gestation of pregnancy: Secondary | ICD-10-CM

## 2018-12-17 DIAGNOSIS — F411 Generalized anxiety disorder: Secondary | ICD-10-CM | POA: Diagnosis not present

## 2018-12-17 MED ORDER — SERTRALINE HCL 50 MG PO TABS: 50.0000 mg | ORAL_TABLET | Freq: Every day | ORAL | 2 refills | Status: DC

## 2018-12-17 NOTE — Progress Notes (Signed)
Subjective:    CC: F/U mood medication.    HPI:  38 year old female is here today to follow-up for anxiety and depression.  She was last evaluated a year ago in February.  She is currently taking sertraline 50 mg daily.  She has now married and has changed her last name.  She was on birth control and I last saw her but she is now 4 months pregnant.  She has some issues with nausea etc. during the first trimester but now that she is in her second trimester has been feeling well.  Her OB is aware she is on Zoloft.  She hasn't been using her Xanax.   Her anatomy screen is at the end of the month.   Past medical history, Surgical history, Family history not pertinant except as noted below, Social history, Allergies, and medications have been entered into the medical record, reviewed, and corrections made.   Review of Systems: No fevers, chills, night sweats, weight loss, chest pain, or shortness of breath.   Objective:    General: Well Developed, well nourished, and in no acute distress.  Neuro: Alert and oriented x3, extra-ocular muscles intact, sensation grossly intact.  HEENT: Normocephalic, atraumatic  Skin: Warm and dry, no rashes. Cardiac: Regular rate and rhythm, no murmurs rubs or gallops, no lower extremity edema.  Respiratory: Clear to auscultation bilaterally. Not using accessory muscles, speaking in full sentences.   Impression and Recommendations:    GAD - doing well overall. She has started exercising  Again.  Will refill her Zoloft.  Hold on the xanax but did leave it on her med list.  Can certainly fill it refill at the end of her pregnancy when she has the baby.  She has always done a great job and using it sparingly.  Pregnancy-following with OB/GYN and under good care.  So far everything has gone well with the pregnancy and she has an anatomy screen at the end of the month.

## 2019-01-28 ENCOUNTER — Encounter: Payer: Self-pay | Admitting: Family Medicine

## 2019-05-28 DIAGNOSIS — Z22330 Carrier of Group B streptococcus: Secondary | ICD-10-CM | POA: Insufficient documentation

## 2019-06-04 ENCOUNTER — Encounter: Payer: Self-pay | Admitting: Family Medicine

## 2019-07-17 ENCOUNTER — Ambulatory Visit (INDEPENDENT_AMBULATORY_CARE_PROVIDER_SITE_OTHER): Payer: BC Managed Care – PPO | Admitting: Family Medicine

## 2019-07-17 ENCOUNTER — Other Ambulatory Visit: Payer: Self-pay

## 2019-07-17 VITALS — BP 127/83 | HR 60 | Temp 98.0°F | Wt 205.0 lb

## 2019-07-17 DIAGNOSIS — S39012A Strain of muscle, fascia and tendon of lower back, initial encounter: Secondary | ICD-10-CM

## 2019-07-17 MED ORDER — NAPROXEN 500 MG PO TABS: 500.0000 mg | ORAL_TABLET | Freq: Two times a day (BID) | ORAL | 3 refills | Status: DC

## 2019-07-17 MED ORDER — CYCLOBENZAPRINE HCL 5 MG PO TABS: 5.0000 mg | ORAL_TABLET | Freq: Three times a day (TID) | ORAL | 1 refills | Status: DC | PRN

## 2019-07-17 NOTE — Patient Instructions (Signed)
Thank you for coming in today. Attend PT.  Use naproxen as needed.  Use cyclobenzaprine muscle relaxer for spasm as needed.  Use heating pad  Use TENS unit.  Come back or go to the emergency room if you notice new weakness new numbness problems walking or bowel or bladder problems.  TENS UNIT: This is helpful for muscle pain and spasm.   Search and Purchase a TENS 7000 2nd edition at  www.tenspros.com or www.[place].com It should be less than $30.     TENS unit instructions: Do not shower or bathe with the unit on Turn the unit off before removing electrodes or batteries If the electrodes lose stickiness add a drop of water to the electrodes after they are disconnected from the unit and place on plastic sheet. If you continued to have difficulty, call the TENS unit company to purchase more electrodes. Do not apply lotion on the skin area prior to use. Make sure the skin is clean and dry as this will help prolong the life of the electrodes. After use, always check skin for unusual red areas, rash or other skin difficulties. If there are any skin problems, does not apply electrodes to the same area. Never remove the electrodes from the unit by pulling the wires. Do not use the TENS unit or electrodes other than as directed. Do not change electrode placement without consultating your therapist or physician. Keep 2 fingers with between each electrode. Wear time ratio is 2:1, on to off times.    For example on for 30 minutes off for 15 minutes and then on for 30 minutes off for 15 minutes     Lumbosacral Strain Lumbosacral strain is an injury that causes pain in the lower back (lumbosacral spine). This injury usually occurs from overstretching the muscles or ligaments along your spine. A strain can affect one or more muscles or cord-like tissues that connect bones to other bones (ligaments). What are the causes? This condition may be caused by:  A hard, direct hit (blow) to the back.   Excessive stretching of the lower back muscles. This may result from: ? A fall. ? Lifting something heavy. ? Repetitive movements such as bending or crouching. What increases the risk? The following factors may increase your risk of getting this condition:  Participating in sports or activities that involve: ? A sudden twist of the back. ? Pushing or pulling motions.  Being overweight or obese.  Having poor strength and flexibility, especially tight hamstrings or weak muscles in the back or abdomen.  Having too much of a curve in the lower back.  Having a pelvis that is tilted forward. What are the signs or symptoms? The main symptom of this condition is pain in the lower back, at the site of the strain. Pain may extend (radiate) down one or both legs. How is this diagnosed? This condition is diagnosed based on:  Your symptoms.  Your medical history.  A physical exam. ? Your health care provider may push on certain areas of your back to determine the source of your pain. ? You may be asked to bend forward, backward, and side to side to assess the severity of your pain and your range of motion.  Imaging tests, such as: ? X-rays. ? MRI.  How is this treated? Treatment for this condition may include:  Putting heat and cold on the affected area.  Medicines to help relieve pain and relax your muscles (muscle relaxants).  NSAIDs to help reduce swelling  and discomfort. When your symptoms improve, it is important to gradually return to your normal routine as soon as possible to reduce pain, avoid stiffness, and avoid loss of muscle strength. Generally, symptoms should improve within 6 weeks of treatment. However, recovery time varies. Follow these instructions at home: Managing pain, stiffness, and swelling   If directed, put ice on the injured area during the first 24 hours after your strain. ? Put ice in a plastic bag. ? Place a towel between your skin and the bag. ?  Leave the ice on for 20 minutes, 2-3 times a day.  If directed, put heat on the affected area as often as told by your health care provider. Use the heat source that your health care provider recommends, such as a moist heat pack or a heating pad. ? Place a towel between your skin and the heat source. ? Leave the heat on for 20-30 minutes. ? Remove the heat if your skin turns bright red. This is especially important if you are unable to feel pain, heat, or cold. You may have a greater risk of getting burned. Activity  Rest and return to your normal activities as told by your health care provider. Ask your health care provider what activities are safe for you.  Avoid activities that take a lot of energy for as long as told by your health care provider. General instructions  Take over-the-counter and prescription medicines only as told by your health care provider.  Donot drive or use heavy machinery while taking prescription pain medicine.  Do not use any products that contain nicotine or tobacco, such as cigarettes and e-cigarettes. If you need help quitting, ask your health care provider.  Keep all follow-up visits as told by your health care provider. This is important. How is this prevented?  Use correct form when playing sports and lifting heavy objects.  Use good posture when sitting and standing.  Maintain a healthy weight.  Sleep on a mattress with medium firmness to support your back.  Be safe and responsible while being active to avoid falls.  Do at least 150 minutes of moderate-intensity exercise each week, such as brisk walking or water aerobics. Try a form of exercise that takes stress off your back, such as swimming or stationary cycling.  Maintain physical fitness, including: ? Strength. ? Flexibility. ? Cardiovascular fitness. ? Endurance. Contact a health care provider if:  Your back pain does not improve after 6 weeks of treatment.  Your symptoms get  worse. Get help right away if:  Your back pain is severe.  You cannot stand or walk.  You have difficulty controlling when you urinate or when you have a bowel movement.  You feel nauseous or you vomit.  Your feet get very cold.  You have numbness, tingling, weakness, or problems using your arms or legs.  You develop any of the following: ? Shortness of breath. ? Dizziness. ? Pain in your legs. ? Weakness in your buttocks or legs. ? Discoloration of the skin on your toes or legs. This information is not intended to replace advice given to you by your health care provider. Make sure you discuss any questions you have with your health care provider. Document Released: 07/13/2005 Document Revised: 01/25/2019 Document Reviewed: 03/06/2016 Elsevier Patient Education  2020 [employer].

## 2019-07-17 NOTE — Progress Notes (Signed)
Kiara [name] is a 38 y.o. female who presents to [facility] today for low back pain.  Kiara [name] has a 3-day history of low back pain.  She is 6 weeks postpartum NSVD first child.  She notes that she has been carrying her child around in a front sling and thinks that may have caused some of her back pain.  She denies any injury.  She has pain is like in the bilateral low back.  Pain does not radiate.  No weakness or numbness distally.  No bowel bladder dysfunction.  She has her 6-week follow-up visit with OB/GYN tomorrow.  She is tried ibuprofen for pain which helped a little.  She is not currently breast-feeding.  No fevers or chills.  She is had similar episodes of this previously occurring about once per year.  She would like some exercises to prevent this from happening again if possible.   ROS:  As above  Exam:  BP 127/83   Pulse 60   Temp 98 F (36.7 C) (Oral)   Wt 205 lb (93 kg)   BMI 29.41 kg/m  Wt Readings from Last 5 Encounters:  07/17/19 205 lb (93 kg)  12/17/18 211 lb (95.7 kg)  12/25/17 195 lb (88.5 kg)  12/04/17 197 lb (89.4 kg)  05/25/17 204 lb (92.5 kg)   General: Well Developed, well nourished, and in no acute distress.  Neuro/Psych: Alert and oriented x3, extra-ocular muscles intact, able to move all 4 extremities, sensation grossly intact. Skin: Warm and dry, no rashes noted.  Respiratory: Not using accessory muscles, speaking in full sentences, trachea midline.  Cardiovascular: Pulses palpable, no extremity edema. Abdomen: Does not appear distended. MSK:  Spine: Normal-appearing Nontender to spinal midline. Tender to palpation lumbar paraspinal musculature.  Paraspinal musculature appears to be in spasm per palpation. Lumbar motion normal extension rotation lateral flexion.  Limited flexion. Extremity strength reflexes and sensation are equal normal throughout. Normal gait.    Lab and Radiology  Results No results found for this or any previous visit (from the past 72 hour(s)). No results found.  EXAM: LUMBAR SPINE - COMPLETE 4+ VIEW  COMPARISON:  None.  FINDINGS: There is no evidence of lumbar spine fracture. Alignment is normal. Intervertebral disc spaces are maintained.  IMPRESSION: Normal lumbar spine.   Electronically Signed   By: Sabino Dick M.D.   On: 07/23/2014 11:39  I personally (independently) visualized and performed the interpretation of the images attached in this note.   Assessment and Plan: 38 y.o. female with lumbosacral strain due to myofascial spasm and dysfunction.  Plan for treatment with naproxen and cyclobenzaprine.  However most importantly will be referral to physical therapy.  Also can use heating pad and TENS unit will proceed with home exercise program as guided by physical therapy.  Recheck as needed.   PDMP not reviewed this encounter. Orders Placed This Encounter  Procedures  . Ambulatory referral to Physical Therapy    Referral Priority:   Routine    Referral Type:   Physical Medicine    Referral Reason:   Specialty Services Required    Requested Specialty:   Physical Therapy   Meds ordered this encounter  Medications  . naproxen (NAPROSYN) 500 MG tablet    Sig: Take 1 tablet (500 mg total) by mouth 2 (two) times daily with a meal.    Dispense:  60 tablet    Refill:  3  . cyclobenzaprine (FLEXERIL) 5 MG tablet  Sig: Take 1-2 tablets (5-10 mg total) by mouth 3 (three) times daily as needed for muscle spasms.    Dispense:  60 tablet    Refill:  1    Historical information moved to improve visibility of documentation.  No past medical history on file. Past Surgical History:  Procedure Laterality Date  . RHINOPLASTY  2007, 2009   Social History   Tobacco Use  . Smoking status: Former Smoker    Types: Cigarettes    Quit date: 10/17/2012    Years since quitting: 6.7  . Smokeless tobacco: Never Used  Substance Use  Topics  . Alcohol use: Yes   family history includes Breast cancer in her maternal grandmother; Hyperlipidemia in her father; Hypertension in her father.  Medications: Current Outpatient Medications  Medication Sig Dispense Refill  . cyclobenzaprine (FLEXERIL) 5 MG tablet Take 1-2 tablets (5-10 mg total) by mouth 3 (three) times daily as needed for muscle spasms. 60 tablet 1  . naproxen (NAPROSYN) 500 MG tablet Take 1 tablet (500 mg total) by mouth 2 (two) times daily with a meal. 60 tablet 3  . sertraline (ZOLOFT) 50 MG tablet Take 1 tablet (50 mg total) by mouth daily. 90 tablet 2   No current facility-administered medications for this visit.    Allergies  Allergen Reactions  . Citalopram Palpitations    tachycardia tachycardia      Discussed warning signs or symptoms. Please see discharge instructions. Patient expresses understanding.

## 2019-07-24 ENCOUNTER — Ambulatory Visit (INDEPENDENT_AMBULATORY_CARE_PROVIDER_SITE_OTHER): Payer: BC Managed Care – PPO | Admitting: Physical Therapy

## 2019-07-24 ENCOUNTER — Other Ambulatory Visit: Payer: Self-pay

## 2019-07-24 ENCOUNTER — Encounter: Payer: Self-pay | Admitting: Physical Therapy

## 2019-07-24 DIAGNOSIS — M545 Low back pain, unspecified: Secondary | ICD-10-CM

## 2019-07-24 NOTE — Therapy (Signed)
[facility] [address] [place], [place], [address] Phone: [phone]   Fax:  [phone]  Physical Therapy Evaluation  Patient Details  Name: Kiara SimasKirsten Ruehle [name] MRN: [id number] Date of Birth: Mar 11, 1981 Referring Provider (PT): Rodolph Bongorey, Evan S, MD   Encounter Date: 07/24/2019  PT End of Session - 07/24/19 1439    Visit Number  1    Number of Visits  6    Date for PT Re-Evaluation  09/04/19    PT Start Time  1348    PT Stop Time  1430    PT Time Calculation (min)  42 min    Activity Tolerance  Patient tolerated treatment well    Behavior During Therapy  [facility] for tasks assessed/performed       History reviewed. No pertinent past medical history.  Past Surgical History:  Procedure Laterality Date  . RHINOPLASTY  2007, 2009    There were no vitals filed for this visit.   Subjective Assessment - 07/24/19 1350    Subjective  Pt is a 38 y/o female who presents to OPPT for LBP.  Pt reports over 1.5 years she's had increasing back pain, with most recent exacerbation about 2 weeks ago.  Pt is currently 7 weeks postpartum, and reports baby wearing x 3 days which exacerbated symptoms.    Patient Stated Goals  guidance for decreasing risk of reinjury    Currently in Pain?  Yes    Pain Score  2    up to 10/10   Pain Location  Back    Pain Orientation  Lower;Mid    Pain Descriptors / Indicators  Sharp;Aching    Pain Type  Chronic pain;Acute pain    Pain Onset  More than a month ago    Pain Frequency  Intermittent    Aggravating Factors   baby wearing; forward bending, using back machine at [employer]    Pain Relieving Factors  medications         [facility] PT Assessment - 07/24/19 1355      Assessment   Medical Diagnosis  S39.012A (ICD-10-CM) - Lumbosacral strain, initial encounter    Referring Provider (PT)  Rodolph Bongorey, Evan S, MD    Onset Date/Surgical Date  07/11/19    Hand Dominance  Right    Next MD Visit   PRN    Prior Therapy  none      Precautions   Precautions  None      Restrictions   Weight Bearing Restrictions  No      Balance Screen   Has the patient fallen in the past 6 months  No    Has the patient had a decrease in activity level because of a fear of falling?   No    Is the patient reluctant to leave their home because of a fear of falling?   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; lots of walking    Leisure  spend time with family; released by MD to return to exercise (owns a Building control surveyoreloton bike)      Cognition   Overall Cognitive Status  Within  Functional Limits for tasks assessed      Observation/Other Assessments   Focus on Therapeutic Outcomes (FOTO)   68 [phone] 17      Posture/Postural Control   Posture/Postural Control  Postural limitations    Postural Limitations  Rounded Shoulders;Forward head      ROM / Strength   AROM / PROM / Strength  AROM;Strength      AROM   AROM Assessment Site  Lumbar    Lumbar Flexion  WNL    Lumbar Extension  WNL    Lumbar - Right Side Bend  WNL    Lumbar - Left Side Bend  WNL    Lumbar - Right Rotation  WNL    Lumbar - Left Rotation  WNL      Strength   Overall Strength  Within functional limits for tasks performed    Strength Assessment Site  Hip;Knee;Ankle      Palpation   Palpation comment  mild tenderness along T10-L3 paraspinals      Special Tests   Other special tests  negative for diastasis recti (< 2 finger separation)                Objective measurements completed on examination: See above findings.      [place] PT Treatment/Exercise - 07/24/19 1355      Self-Care   Self-Care  Other Self-Care  Comments    Other Self-Care Comments   educated on gradual return to exercise as she is cleared by OB/GYN; also discussed core progression and what to look for with exercises/how to modify activities      Exercises   Exercises  Lumbar      Lumbar Exercises: Stretches   Single Knee to Chest Stretch  Right;Left;1 rep;20 seconds    Double Knee to Chest Stretch  1 rep;20 seconds    Piriformis Stretch  Right;Left;1 rep;20 seconds      Lumbar Exercises: Supine   Ab Set  5 reps;5 seconds    Clam  5 reps    Clam Limitations  bil; cues for abdominal activation    Heel Slides  5 reps    Heel Slides Limitations  bil; cues for abdominal activation    Bent Knee Raise  5 reps    Bent Knee Raise Limitations  bil; cues for abdominal activation             PT Education - 07/24/19 1439    Education Details  HEP, [email or web address] website for postpartum exercise    Person(s) Educated  Patient    Methods  Explanation;Demonstration;Handout    Comprehension  Verbalized understanding;Returned demonstration          PT Long Term Goals - 07/24/19 1445      PT LONG TERM GOAL #1   Title  to be determined if pt returns             Plan - 07/24/19 1439    Clinical Impression Statement  Pt is a 38 y/o female who presents to OPPT for acute exacerbation of chronic LBP.  Pt today reports symptoms mostly resolved, and current exacerbation likely due to recent baby wearing.  Discussed fit for baby wearing, as well as core strengthening progression to decrease risk of reinjury.  Pt demonstrates poor core strength due to recent pregnancy, and new posture/body mechanics changes during postpartum recovery.  Pt provided with post parturm recovery exercises and discussed gradual return to cardio.  Pt verbalized understanding and feel pt should be  able to progress independently.  Will hold PT x 30 days and pt to call if needed.    Personal Factors and Comorbidities  Comorbidity 1    Comorbidities  7  weeks postpartum    Examination-Activity Limitations  Bend;Caring for Others    Examination-Participation Restrictions  Other   work   Stability/Clinical Decision Making  Stable/Uncomplicated    Clinical Decision Making  Low    Rehab Potential  Good    PT Frequency  1x / week    PT Duration  6 weeks    PT Treatment/Interventions  ADLs/Self Care Home Management;Cryotherapy;Electrical Stimulation;Ultrasound;Traction;Moist Heat;Iontophoresis 4mg /ml Dexamethasone;Functional mobility training;Therapeutic activities;Therapeutic exercise;Patient/family education;Manual techniques;Passive range of motion;Taping;Dry needling    PT Next Visit Plan  if pt returns reassess and write goals, will d/c if she doesn't return    PT Home Exercise Plan  Access Code: [email or web address]    Consulted and Agree with Plan of Care  Patient       Patient will benefit from skilled therapeutic intervention in order to improve the following deficits and impairments:  Pain, Decreased strength, Increased muscle spasms, Increased fascial restricitons  Visit Diagnosis: Acute midline low back pain without sciatica - Plan: PT plan of care cert/re-cert     Problem List Patient Active Problem List   Diagnosis Date Noted  . Acute bilateral low back pain without sciatica 12/25/2017  . Right foot pain 08/08/2016  . Uses oral contraception 02/18/2016  . Acquired ankle pronation 01/08/2016  . Rhinitis 12/18/2015  . Metatarsal stress fracture of left foot 11/02/2015  . Anxiety state, unspecified 12/06/2013  . Cold sore 03/02/2011  . INSOMNIA 05/26/2009      07/26/2009, PT, DPT 07/24/19 2:48 PM     [facility] [address] [place], [place], [place] Phone: [phone]   Fax:  [phone]  Name: Kiara [name] MRN: Kiara Simas Date of Birth: 1981/06/18

## 2019-07-24 NOTE — Patient Instructions (Signed)
Access Code: [facility]  URL: [email or web address]  Date: 07/24/2019  Prepared by: Faustino Congress   Exercises  Supine Double Knee to Chest - 3 reps - 1 sets - 30 sec hold - 2x daily - 7x weekly  Hooklying Single Knee to Chest - 3 reps - 1 sets - 30 sec hold - 2x daily - 7x weekly  Supine Piriformis Stretch with Foot on Ground - 3 reps - 1 sets - 30 sec hold - 2x daily - 7x weekly  Standing Paraspinals Mobilization with Small Ball on Wall - 1 reps - 1 sets - 3-5 min hold - 2x daily - 7x weekly  Supine Posterior Pelvic Tilt - 10 reps - 1-2 sets - 5 sec hold - 2x daily - 7x weekly  Bent Knee Fallouts - 10 reps - 1-2 sets - 2x daily - 7x weekly  Supine March - 10 reps - 1-2 sets - 2x daily - 7x weekly  Supine Heel Slides - 10 reps - 1-2 sets - 2x daily - 7x weekly

## 2019-09-16 ENCOUNTER — Telehealth: Payer: Self-pay | Admitting: Family Medicine

## 2019-09-16 MED ORDER — SERTRALINE HCL 50 MG PO TABS: 50.0000 mg | ORAL_TABLET | Freq: Every day | ORAL | 3 refills | Status: DC

## 2019-09-16 MED ORDER — ALPRAZOLAM 0.5 MG PO TABS: 0.5000 mg | ORAL_TABLET | Freq: Every evening | ORAL | 1 refills | Status: DC | PRN

## 2019-09-16 NOTE — Telephone Encounter (Signed)
Please schedule for virtual appointment.  We can always send the refills if she cannot do the appointment until next week.  Just let me know.

## 2019-09-16 NOTE — Addendum Note (Signed)
Addended by: Beatrice Lecher D on: 09/16/2019 06:34 PM   Modules accepted: Orders

## 2019-09-16 NOTE — Telephone Encounter (Signed)
Patient states that she had about a week left of medication but wanted to discuss the skin tags. Appointment has been made for tomorrow for virtual. No further questions at this time.

## 2019-09-16 NOTE — Telephone Encounter (Signed)
RF sent.

## 2019-09-16 NOTE — Telephone Encounter (Signed)
Patient calling in stating she needs a refill on her sertraline (ZOLOFT) 50 MG tablet [[id number]] and ALPRAZolam (XANAX) 0.5 MG tablet [[id number]]. Last office visit with PCP was 12/17/2018. Patient would also like to discuss issues where her hemorrhoids were there are now skin tags. Patient would like them either cut off/removed. Please advise.

## 2019-09-17 ENCOUNTER — Ambulatory Visit (INDEPENDENT_AMBULATORY_CARE_PROVIDER_SITE_OTHER): Payer: BC Managed Care – PPO | Admitting: Family Medicine

## 2019-09-17 ENCOUNTER — Encounter: Payer: Self-pay | Admitting: Family Medicine

## 2019-09-17 DIAGNOSIS — K649 Unspecified hemorrhoids: Secondary | ICD-10-CM | POA: Diagnosis not present

## 2019-09-17 DIAGNOSIS — F411 Generalized anxiety disorder: Secondary | ICD-10-CM | POA: Diagnosis not present

## 2019-09-17 NOTE — Assessment & Plan Note (Signed)
We will send over new prescription for sertraline as well as alprazolam.  Just reminded her to use sparingly.  If she is otherwise doing well we will follow her up in 6 months.  She is back to work but working virtually from home.  She is still exercising regularly which is great.

## 2019-09-17 NOTE — Progress Notes (Signed)
Virtual Visit via Video Note  I connected with Kiara [name] on 09/17/19 at  9:10 AM EST by a video enabled telemedicine application and verified that I am speaking with the correct person using two identifiers.   I discussed the limitations of evaluation and management by telemedicine and the availability of in person appointments. The patient expressed understanding and agreed to proceed.     Established Patient Office Visit  Subjective:  Patient ID: Kiara [name], female    DOB: [birth year]  Age: 38 y.o. MRN: [id number]  CC:  Chief Complaint  Patient presents with  . Follow-up    HPI Kiara [name] presents for Anxiety.    She is overall doing well.  She just had a new baby, Kiara [name] about 3 months ago.  She is not breast-feeding and is not currently pregnant and would like to restart her sertraline 50 mg.  She has been back at work but she has been working remotely at home.  She would also like a refill on her Xanax which she uses very sparingly.   She also wanted to discuss excess hemorrhoidal tissue.  She says particularly during delivery she had really large inflamed hemorrhoids.  She says since then she has not had a lot of pain or inflammation but just has a lot of extra redundant skin in the rectal area and wonders if it could actually be improved or corrected.  She has not had her flu shot yet this year.  History reviewed. No pertinent past medical history.  Past Surgical History:  Procedure Laterality Date  . RHINOPLASTY  2007, 2009    Family History  Problem Relation Age of Onset  . Breast cancer Maternal Grandmother   . Hyperlipidemia Father   . Hypertension Father     Social History   Socioeconomic History  . Marital status: Divorced    Spouse name: Not on file  . Number of children: Not on file  . Years of education: Masters  . Highest education level: Not on file  Occupational History  . 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 Financial resource strain: Not on file  . Food insecurity    Worry: Not on file    Inability: Not on file  . Transportation needs    Medical: Not on file    Non-medical: Not on file  Tobacco Use  . Smoking status: Former Smoker    Types: Cigarettes    Quit date: 10/17/2012    Years since quitting: 6.9  . Smokeless tobacco: Never Used  Substance and Sexual Activity  . Alcohol use: Yes  . Drug use: No  . Sexual activity: Yes  Lifestyle  . Physical activity    Days per week: Not on file    Minutes per session: Not on file  . Stress: Not on file  Relationships  . Social Herbalist on phone: Not on file    Gets together: Not on file    Attends religious service: Not on file    Active member of club or organization: Not on file    Attends meetings of clubs or organizations: Not on file    Relationship status: Not on file  . Intimate partner violence    Fear of current or ex partner: Not on file    Emotionally abused: Not on file    Physically abused: Not on file    Forced sexual activity: Not on  file  Other Topics Concern  . Not on file  Social History Narrative   No regular exercise. Son Kiara [name] born 2020.    Outpatient Medications Prior to Visit  Medication Sig Dispense Refill  . TRI-LO-SPRINTEC 0.18/0.215/0.25 MG-25 MCG tab TK 1 T PO QD    . ALPRAZolam (XANAX) 0.5 MG tablet Take 1 tablet (0.5 mg total) by mouth at bedtime as needed for anxiety or sleep. 30 tablet 1  . sertraline (ZOLOFT) 50 MG tablet Take 1 tablet (50 mg total) by mouth daily. 90 tablet 3   No facility-administered medications prior to visit.     Allergies  Allergen Reactions  . Citalopram Palpitations    tachycardia tachycardia    ROS Review of Systems    Objective:    Physical Exam  Constitutional: She is oriented to person, place, and time. She appears well-developed and well-nourished.  HENT:  Head: Normocephalic and  atraumatic.  Eyes: Conjunctivae and EOM are normal.  Pulmonary/Chest: Effort normal.  Neurological: She is alert and oriented to person, place, and time.  Skin: Skin is dry. No pallor.  Psychiatric: She has a normal mood and affect. Her behavior is normal.  Vitals reviewed.   There were no vitals taken for this visit. Wt Readings from Last 3 Encounters:  07/17/19 205 lb (93 kg)  12/17/18 211 lb (95.7 kg)  12/25/17 195 lb (88.5 kg)     There are no preventive care reminders to display for this patient.  There are no preventive care reminders to display for this patient.  Lab Results  Component Value Date   TSH 1.69 05/13/2016   Lab Results  Component Value Date   WBC 6.3 05/13/2016   HGB 13.0 10/23/2018   HCT 38.0 10/23/2018   MCV 84 10/23/2018   PLT 253x10E3/uL 10/23/2018   Lab Results  Component Value Date   NA 137 05/13/2016   K 4.5 05/13/2016   CO2 24 05/13/2016   GLUCOSE 78 05/13/2016   BUN 11 05/13/2016   CREATININE 0.81 05/13/2016   BILITOT 0.7 05/13/2016   ALKPHOS 40 05/13/2016   AST 23 05/13/2016   ALT 16 05/13/2016   PROT 7.2 05/13/2016   ALBUMIN 4.2 05/13/2016   CALCIUM 9.4 05/13/2016   Lab Results  Component Value Date   CHOL 176 06/25/2012   Lab Results  Component Value Date   HDL 56 06/25/2012   Lab Results  Component Value Date   LDLCALC 103 (H) 06/25/2012   Lab Results  Component Value Date   TRIG 83 06/25/2012   Lab Results  Component Value Date   CHOLHDL 3.1 06/25/2012   No results found for: HGBA1C    Assessment & Plan:   Problem List Items Addressed This Visit      Cardiovascular and Mediastinum   Hemorrhoids    Sounds like she has hemorrhoidal skin tags.  Will refer to general surgeon.  We did discuss that sometimes I do not recommend removal.  It depends on how much excess skin is present as it can increase risk of scarring but I think it would be reasonable for her to have a nonurgent consultation with colorectal  surgeon to discuss treatment options.      Relevant Orders   Ambulatory referral to General Surgery     Other   Anxiety state - Primary    We will send over new prescription for sertraline as well as alprazolam.  Just reminded her to use sparingly.  If she is otherwise  doing well we will follow her up in 6 months.  She is back to work but working virtually from home.  She is still exercising regularly which is great.         No orders of the defined types were placed in this encounter.   Follow-up: Return in about 6 months (around 03/17/2020), or if symptoms worsen or fail to improve, for Mood medicaitons .    I discussed the assessment and treatment plan with the patient. The patient was provided an opportunity to ask questions and all were answered. The patient agreed with the plan and demonstrated an understanding of the instructions.   The patient was advised to call back or seek an in-person evaluation if the symptoms worsen or if the condition fails to improve as anticipated.    Nani Gasseratherine , MD

## 2019-09-17 NOTE — Assessment & Plan Note (Signed)
Sounds like she has hemorrhoidal skin tags.  Will refer to general surgeon.  We did discuss that sometimes I do not recommend removal.  It depends on how much excess skin is present as it can increase risk of scarring but I think it would be reasonable for her to have a nonurgent consultation with colorectal surgeon to discuss treatment options.

## 2019-09-17 NOTE — Progress Notes (Signed)
Pt would like refills however, these were sent to her pharmacy on 09/16/2019.  Pt reports that after giving birth she had some issues with hemorrhoids and after they resolved she noticed that she has some "loose" skin in that area and wanted to know if Dr. Madilyn Fireman had any suggestions for her about how to proceed with this.

## 2019-09-18 ENCOUNTER — Ambulatory Visit (INDEPENDENT_AMBULATORY_CARE_PROVIDER_SITE_OTHER): Payer: BC Managed Care – PPO | Admitting: Physician Assistant

## 2019-09-18 DIAGNOSIS — Z23 Encounter for immunization: Secondary | ICD-10-CM | POA: Diagnosis not present

## 2019-09-26 DIAGNOSIS — K5902 Outlet dysfunction constipation: Secondary | ICD-10-CM | POA: Insufficient documentation

## 2019-11-01 DIAGNOSIS — K644 Residual hemorrhoidal skin tags: Secondary | ICD-10-CM | POA: Insufficient documentation

## 2019-11-10 ENCOUNTER — Emergency Department
Admission: EM | Admit: 2019-11-10 | Discharge: 2019-11-10 | Disposition: A | Discharge status: Home / Self Care | Payer: BC Managed Care – PPO | Source: Home / Self Care

## 2019-11-10 ENCOUNTER — Other Ambulatory Visit: Payer: Self-pay

## 2019-11-10 DIAGNOSIS — H66001 Acute suppurative otitis media without spontaneous rupture of ear drum, right ear: Secondary | ICD-10-CM | POA: Diagnosis not present

## 2019-11-10 MED ORDER — FLUCONAZOLE 150 MG PO TABS: 150.0000 mg | ORAL_TABLET | Freq: Once | ORAL | 0 refills | Status: AC

## 2019-11-10 MED ORDER — AMOXICILLIN 875 MG PO TABS: 875.0000 mg | ORAL_TABLET | Freq: Two times a day (BID) | ORAL | 0 refills | Status: DC

## 2019-11-10 NOTE — ED Triage Notes (Signed)
Pt states that right ear pain started on Friday.  Painful to touch, feels full.  No drainage per patient.

## 2019-11-10 NOTE — ED Provider Notes (Addendum)
[facility]    CSN: [id number] Arrival date & time: 11/10/19  0931      History   Chief Complaint Chief Complaint  Patient presents with  . Otalgia    HPI Kiara [name] is a 39 y.o. female.   HPI  Patient presents today with a complain of right ear pain x 3 days.  Endorses inner ear canal pain, tenderness of tragus, inner fullness and pain. No history of recurrent otitis. Denies recent submerging underwater. No known history of ET dysfunction. No fever, body aches, chills, N&V, dizziness, or headache. No past medical history on file.  Patient Active Problem List   Diagnosis Date Noted  . Hemorrhoids 09/17/2019  . Normal labor 06/05/2019  . Carrier of group B Streptococcus 05/28/2019  . Acute bilateral low back pain without sciatica 12/25/2017  . Abnormal mammogram of right breast 01/02/2017  . Right foot pain 08/08/2016  . Uses oral contraception 02/18/2016  . Acquired ankle pronation 01/08/2016  . Metatarsal stress fracture of left foot 11/02/2015  . Sleepiness 01/29/2014  . Snoring 01/29/2014  . Anxiety state 12/06/2013  . Cold sore 03/02/2011  . 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 INSOMNIA 05/26/2009    Past Surgical History:  Procedure Laterality Date  . RHINOPLASTY  2007, 2009    OB History   No obstetric history on file.      Home Medications    Prior to Admission medications   Medication Sig Start Date End Date Taking? Authorizing Provider  ALPRAZolam Prudy Feeler) 0.5 MG tablet Take 1 tablet (0.5 mg total) by mouth at bedtime as needed for anxiety or sleep. 09/16/19   Agapito Games, MD  sertraline (ZOLOFT) 50 MG tablet Take 1 tablet (50 mg total) by mouth daily. 09/16/19   Agapito Games, MD  TRI-LO-SPRINTEC 0.18/0.215/0.25 MG-25 MCG tab TK 1 T PO QD 07/18/19   [provider]    Family History Family History  Problem Relation Age of Onset  . Breast cancer Maternal Grandmother   . Hyperlipidemia Father   .  Hypertension Father     Social History Social History   Tobacco Use  . Smoking status: Former Smoker    Types: Cigarettes    Quit date: 10/17/2012    Years since quitting: 7.0  . Smokeless tobacco: Never Used  Substance Use Topics  . Alcohol use: Yes  . Drug use: No     Allergies   Citalopram   Review of Systems Review of Systems Pertinent negatives listed in HPI Physical Exam Triage Vital Signs ED Triage Vitals  Enc Vitals Group     BP 11/10/19 0953 114/75     Pulse Rate 11/10/19 0953 63     Resp 11/10/19 0953 20     Temp 11/10/19 0953 98.1 F (36.7 C)     Temp Source 11/10/19 0953 Oral     SpO2 11/10/19 0953 98 %     Weight 11/10/19 0954 208 lb (94.3 kg)     Height 11/10/19 0954 5\' 10"  (1.778 m)     Head Circumference --      Peak Flow --      Pain Score 11/10/19 0953 3     Pain Loc --      Pain Edu? --      Excl. in GC? --    No data found.  Updated Vital Signs BP 114/75 (BP Location: Right Arm)   Pulse 63   Temp 98.1 F (36.7 C) (Oral)  Resp 20   Ht 5\' 10"  (1.778 m)   Wt 208 lb (94.3 kg)   LMP 11/04/2019   SpO2 98%   BMI 29.84 kg/m   Visual Acuity Right Eye Distance:   Left Eye Distance:   Bilateral Distance:    Right Eye Near:   Left Eye Near:    Bilateral Near:     Physical Exam Constitutional:      Appearance: Normal appearance.  HENT:     Right Ear: Swelling and tenderness present. Tympanic membrane is perforated.     Left Ear: Hearing, tympanic membrane, ear canal and external ear normal.     Ears:     Comments: Erythematous ear canal, tragus tenderness, preauricular adenopathy    Nose: Nose normal.  Cardiovascular:     Rate and Rhythm: Normal rate and regular rhythm.  Pulmonary:     Effort: Pulmonary effort is normal.     Breath sounds: Normal breath sounds.  Skin:    General: Skin is warm and dry.  Neurological:     Mental Status: She is alert.    UC Treatments / Results  Labs (all labs ordered are listed, but only  abnormal results are displayed) Labs Reviewed - No data to display  EKG   Radiology No results found.  Procedures Procedures (including critical care time)  Medications Ordered in UC Medications - No data to display  Initial Impression / Assessment and Plan / UC Course  I have reviewed the triage vital signs and the nursing notes.  Pertinent labs & imaging results that were available during my care of the patient were reviewed by me and considered in my medical decision making (see chart for details).   Non-recurrent acute otitis media with TM perforation. No recent COVID-19 infection or exposure. Treating with antimicrobial therapy and recommend starting Flonase and antihistamine therapy in conjunction with antibiotic therapy. Final Clinical Impressions(s) / UC Diagnoses   Final diagnoses:  Non-recurrent acute suppurative otitis media of right ear without spontaneous rupture of tympanic membrane     Discharge Instructions     You're being treated for ear infection involving the right ear. Start amoxicillin 875 1 tablet twice daily x10 days. Also recommend over-the-counter Flonase and/or an antihistamine if ear congestion or fullness symptoms persist once pain from infection resolves. If no improvement following completion of therapy please follow-up for additional evaluation.    ED Prescriptions    Medication Sig Dispense Auth. Provider   amoxicillin (AMOXIL) 875 MG tablet Take 1 tablet (875 mg total) by mouth 2 (two) times daily. 20 tablet Scot Jun, FNP   fluconazole (DIFLUCAN) 150 MG tablet Take 1 tablet (150 mg total) by mouth once for 1 dose. Repeat if needed 2 tablet Scot Jun, FNP     PDMP not reviewed this encounter.   Scot Jun, FNP 11/12/19 2155    Scot Jun, FNP 11/12/19 2156

## 2019-11-10 NOTE — Discharge Instructions (Addendum)
You're being treated for ear infection involving the right ear. Start amoxicillin 875 1 tablet twice daily x10 days. Also recommend over-the-counter Flonase and/or an antihistamine if ear congestion or fullness symptoms persist once pain from infection resolves. If no improvement following completion of therapy please follow-up for additional evaluation.

## 2019-11-22 LAB — HM MAMMOGRAPHY

## 2020-09-06 ENCOUNTER — Other Ambulatory Visit: Payer: Self-pay | Admitting: Family Medicine

## 2020-09-21 ENCOUNTER — Encounter: Payer: Self-pay | Admitting: Family Medicine

## 2020-09-21 ENCOUNTER — Other Ambulatory Visit: Payer: Self-pay

## 2020-09-21 ENCOUNTER — Ambulatory Visit (INDEPENDENT_AMBULATORY_CARE_PROVIDER_SITE_OTHER): Payer: BC Managed Care – PPO | Admitting: Family Medicine

## 2020-09-21 VITALS — BP 116/58 | HR 67 | Ht 70.0 in | Wt 218.0 lb

## 2020-09-21 DIAGNOSIS — Z23 Encounter for immunization: Secondary | ICD-10-CM

## 2020-09-21 DIAGNOSIS — R635 Abnormal weight gain: Secondary | ICD-10-CM

## 2020-09-21 DIAGNOSIS — Z0000 Encounter for general adult medical examination without abnormal findings: Secondary | ICD-10-CM

## 2020-09-21 MED ORDER — ALPRAZOLAM 0.5 MG PO TABS: 0.5000 mg | ORAL_TABLET | Freq: Every evening | ORAL | 0 refills | Status: DC | PRN

## 2020-09-21 MED ORDER — PHENTERMINE HCL 15 MG PO CAPS: 15.0000 mg | ORAL_CAPSULE | ORAL | 0 refills | Status: DC

## 2020-09-21 MED ORDER — SERTRALINE HCL 50 MG PO TABS: ORAL_TABLET | ORAL | 1 refills | Status: DC

## 2020-09-21 NOTE — Patient Instructions (Signed)

## 2020-09-21 NOTE — Progress Notes (Signed)
CPE - Established Patient Office Visit  Subjective:  Patient ID: Kiara [name], female    DOB: 1981-03-23  Age: 39 y.o. MRN: [id number]  CC:  Chief Complaint  Patient presents with  . Annual Exam    HPI Kiara [name] presents for CPE.  She is doing really well overall.  She is up 18 pounds more than what she weighed before she gave birth to her son.  He is now 60 months old.  She still exercises very regularly but admits her diet has not been that great she is thinking about starting the online weight watchers program.  He has been struggling feeling a little bit more tired and fatigued.  She did well let me know she is having her tubes tied in January so not need birth control after that.  She does need a refill on her sertraline as well as loprazolam she says she still has some alprazolam left from last year but knows that it will expire soon so would like to have a refill sent if possible.    History reviewed. No pertinent past medical history.  Past Surgical History:  Procedure Laterality Date  . RHINOPLASTY  2007, 2009    Family History  Problem Relation Age of Onset  . Breast cancer Maternal Grandmother   . Hyperlipidemia Father   . Hypertension Father     Social History   Socioeconomic History  . Marital status: Married    Spouse name: Thayer Ohm  . Number of children: 1  . Years of education: Masters  . Highest education level: Not on file  Occupational History  . 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 Smoking status: Former Smoker    Types: Cigarettes    Quit date: 10/17/2012    Years since quitting: 7.9  . Smokeless tobacco: Never Used  Substance and Sexual Activity  . Alcohol use: Yes  . Drug use: No  . Sexual activity: Yes  Other Topics Concern  . Not on file  Social History Narrative   No regular exercise. Son Knox Royalty born 2020.   Social Determinants of Health   Financial Resource  Strain:   . Difficulty of Paying Living Expenses: Not on file  Food Insecurity:   . Worried About Programme researcher, broadcasting/film/video in the Last Year: Not on file  . Ran Out of Food in the Last Year: Not on file  Transportation Needs:   . Lack of Transportation (Medical): Not on file  . Lack of Transportation (Non-Medical): Not on file  Physical Activity:   . Days of Exercise per Week: Not on file  . Minutes of Exercise per Session: Not on file  Stress:   . Feeling of Stress : Not on file  Social Connections:   . Frequency of Communication with Friends and Family: Not on file  . Frequency of Social Gatherings with Friends and Family: Not on file  . Attends Religious Services: Not on file  . Active Member of Clubs or Organizations: Not on file  . Attends Banker Meetings: Not on file  . Marital Status: Not on file  Intimate Partner Violence:   . Fear of Current or Ex-Partner: Not on file  . Emotionally Abused: Not on file  . Physically Abused: Not on file  . Sexually Abused: Not on file    Outpatient Medications Prior to Visit  Medication Sig Dispense Refill  . TRI-LO-SPRINTEC 0.18/0.215/0.25 MG-25 MCG tab TK  1 T PO QD    . ALPRAZolam (XANAX) 0.5 MG tablet Take 1 tablet (0.5 mg total) by mouth at bedtime as needed for anxiety or sleep. 30 tablet 1  . amoxicillin (AMOXIL) 875 MG tablet Take 1 tablet (875 mg total) by mouth 2 (two) times daily. 20 tablet 0  . sertraline (ZOLOFT) 50 MG tablet TAKE 1 TABLET(50 MG) BY MOUTH DAILY 30 tablet 0   No facility-administered medications prior to visit.    Allergies  Allergen Reactions  . Citalopram Palpitations    tachycardia tachycardia    ROS Review of Systems    Objective:    Physical Exam Vitals reviewed.  Constitutional:      Appearance: Normal appearance. She is well-developed.  HENT:     Head: Normocephalic and atraumatic.     Right Ear: Tympanic membrane, ear canal and external ear normal.     Left Ear: Tympanic  membrane, ear canal and external ear normal.     Nose: Nose normal.     Mouth/Throat:     Mouth: Mucous membranes are moist.     Pharynx: Oropharynx is clear. No oropharyngeal exudate or posterior oropharyngeal erythema.  Eyes:     Conjunctiva/sclera: Conjunctivae normal.     Pupils: Pupils are equal, round, and reactive to light.  Neck:     Thyroid: No thyromegaly.  Cardiovascular:     Rate and Rhythm: Normal rate and regular rhythm.     Heart sounds: Normal heart sounds.  Pulmonary:     Effort: Pulmonary effort is normal.     Breath sounds: Normal breath sounds. No wheezing.  Abdominal:     General: Abdomen is flat.     Palpations: Abdomen is soft.  Musculoskeletal:        General: No swelling or deformity.     Cervical back: Neck supple.  Lymphadenopathy:     Cervical: No cervical adenopathy.  Skin:    General: Skin is warm and dry.     Coloration: Skin is not pale.  Neurological:     Mental Status: She is alert and oriented to person, place, and time.  Psychiatric:        Behavior: Behavior normal.     BP (!) 116/58   Pulse 67   Ht 5\' 10"  (1.778 m)   Wt 218 lb (98.9 kg)   SpO2 100%   BMI 31.28 kg/m  Wt Readings from Last 3 Encounters:  09/21/20 218 lb (98.9 kg)  11/10/19 208 lb (94.3 kg)  07/17/19 205 lb (93 kg)     Health Maintenance Due  Topic Date Due  . Hepatitis C Screening  Never done  . HIV Screening  Never done    There are no preventive care reminders to display for this patient.  Lab Results  Component Value Date   TSH 1.69 05/13/2016   Lab Results  Component Value Date   WBC 6.3 05/13/2016   HGB 13.0 10/23/2018   HCT 38.0 10/23/2018   MCV 84 10/23/2018   PLT 253x10E3/uL 10/23/2018   Lab Results  Component Value Date   NA 137 05/13/2016   K 4.5 05/13/2016   CO2 24 05/13/2016   GLUCOSE 78 05/13/2016   BUN 11 05/13/2016   CREATININE 0.81 05/13/2016   BILITOT 0.7 05/13/2016   ALKPHOS 40 05/13/2016   AST 23 05/13/2016   ALT 16  05/13/2016   PROT 7.2 05/13/2016   ALBUMIN 4.2 05/13/2016   CALCIUM 9.4 05/13/2016   Lab Results  Component Value Date   CHOL 176 06/25/2012   Lab Results  Component Value Date   HDL 56 06/25/2012   Lab Results  Component Value Date   LDLCALC 103 (H) 06/25/2012   Lab Results  Component Value Date   TRIG 83 06/25/2012   Lab Results  Component Value Date   CHOLHDL 3.1 06/25/2012   No results found for: HGBA1C    Assessment & Plan:   Problem List Items Addressed This Visit    None    Visit Diagnoses    Need for immunization against influenza    -  Primary   Relevant Orders   Flu Vaccine QUAD 36+ mos IM (Completed)   Wellness examination       Relevant Orders   COMPLETE METABOLIC PANEL WITH GFR   Lipid Panel w/reflex Direct LDL   CBC with Differential/Platelet   TSH   Abnormal weight gain       Relevant Medications   phentermine 15 MG capsule      Meds ordered this encounter  Medications  . ALPRAZolam (XANAX) 0.5 MG tablet    Sig: Take 1 tablet (0.5 mg total) by mouth at bedtime as needed for anxiety or sleep.    Dispense:  30 tablet    Refill:  0  . sertraline (ZOLOFT) 50 MG tablet    Sig: TAKE 1 TABLET(50 MG) BY MOUTH DAILY    Dispense:  90 tablet    Refill:  1  . phentermine 15 MG capsule    Sig: Take 1 capsule (15 mg total) by mouth every morning.    Dispense:  30 capsule    Refill:  0    Follow-up: Return in about 4 weeks (around 10/19/2020) for New start medication.    Nani Gasser, MD

## 2020-09-22 LAB — LIPID PANEL W/REFLEX DIRECT LDL
Cholesterol: 193 mg/dL (ref <200)
HDL: 74 mg/dL (ref >=50)
LDL Cholesterol (Calc): 100 mg/dL (calc) — ABNORMAL HIGH
Non-HDL Cholesterol (Calc): 119 mg/dL (calc) (ref <130)
Total CHOL/HDL Ratio: 2.6 (calc) (ref <5.0)
Triglycerides: 96 mg/dL (ref <150)

## 2020-09-22 LAB — CBC WITH DIFFERENTIAL/PLATELET
Absolute Monocytes: 383 cells/uL (ref 200–950)
Basophils Absolute: 40 cells/uL (ref 0–200)
Basophils Relative: 0.6 %
Eosinophils Absolute: 119 cells/uL (ref 15–500)
Eosinophils Relative: 1.8 %
HCT: 38.7 % (ref 35.0–45.0)
Hemoglobin: 13 g/dL (ref 11.7–15.5)
Lymphs Abs: 2039 cells/uL (ref 850–3900)
MCH: 28.5 pg (ref 27.0–33.0)
MCHC: 33.6 g/dL (ref 32.0–36.0)
MCV: 84.9 fL (ref 80.0–100.0)
MPV: 9.7 fL (ref 7.5–12.5)
Monocytes Relative: 5.8 %
Neutro Abs: 4019 cells/uL (ref 1500–7800)
Neutrophils Relative %: 60.9 %
Platelets: 254 10*3/uL (ref 140–400)
RBC: 4.56 10*6/uL (ref 3.80–5.10)
RDW: 12.6 % (ref 11.0–15.0)
Total Lymphocyte: 30.9 %
WBC: 6.6 10*3/uL (ref 3.8–10.8)

## 2020-09-22 LAB — COMPLETE METABOLIC PANEL WITH GFR
AG Ratio: 1.6 (calc) (ref 1.0–2.5)
ALT: 9 U/L (ref 6–29)
AST: 11 U/L (ref 10–30)
Albumin: 4.4 g/dL (ref 3.6–5.1)
Alkaline phosphatase (APISO): 60 U/L (ref 31–125)
BUN: 14 mg/dL (ref 7–25)
CO2: 26 mmol/L (ref 20–32)
Calcium: 9.6 mg/dL (ref 8.6–10.2)
Chloride: 104 mmol/L (ref 98–110)
Creat: 0.75 mg/dL (ref 0.50–1.10)
GFR, Est African American: 116 mL/min/{1.73_m2} (ref >=60)
GFR, Est Non African American: 100 mL/min/{1.73_m2} (ref >=60)
Globulin: 2.7 g/dL (calc) (ref 1.9–3.7)
Glucose, Bld: 98 mg/dL (ref 65–99)
Potassium: 4.6 mmol/L (ref 3.5–5.3)
Sodium: 137 mmol/L (ref 135–146)
Total Bilirubin: 0.6 mg/dL (ref 0.2–1.2)
Total Protein: 7.1 g/dL (ref 6.1–8.1)

## 2020-09-22 LAB — TSH: TSH: 3.95 mIU/L

## 2020-09-28 ENCOUNTER — Encounter: Payer: BC Managed Care – PPO | Admitting: Family Medicine

## 2020-10-08 ENCOUNTER — Telehealth: Payer: Self-pay | Admitting: Neurology

## 2020-10-08 NOTE — Telephone Encounter (Signed)
Prior Authorization for Phentermine submitted via covermymeds. Awaiting response. Your information has been submitted to [employer]. To check for an updated outcome later, reopen this PA request from your dashboard.  If [employer] has not responded to your request within 24 hours, contact [employer] at [phone]. If you think there may be a problem with your PA request, use our live chat feature at the bottom right. 

## 2020-10-08 NOTE — Telephone Encounter (Signed)
Received PA approval via [facility] [employer] valid 10/08/2020-01/06/2021.

## 2020-10-12 ENCOUNTER — Other Ambulatory Visit: Payer: Self-pay | Admitting: Family Medicine

## 2020-10-15 ENCOUNTER — Other Ambulatory Visit: Payer: Self-pay | Admitting: Family Medicine

## 2020-10-20 ENCOUNTER — Ambulatory Visit (INDEPENDENT_AMBULATORY_CARE_PROVIDER_SITE_OTHER): Payer: BC Managed Care – PPO | Admitting: Family Medicine

## 2020-10-20 ENCOUNTER — Other Ambulatory Visit: Payer: Self-pay

## 2020-10-20 ENCOUNTER — Encounter: Payer: Self-pay | Admitting: Family Medicine

## 2020-10-20 VITALS — BP 118/74 | HR 59 | Ht 70.0 in | Wt 207.4 lb

## 2020-10-20 DIAGNOSIS — Z6829 Body mass index (BMI) 29.0-29.9, adult: Secondary | ICD-10-CM

## 2020-10-20 DIAGNOSIS — R635 Abnormal weight gain: Secondary | ICD-10-CM

## 2020-10-20 MED ORDER — PHENTERMINE HCL 15 MG PO CAPS: 15.0000 mg | ORAL_CAPSULE | ORAL | 0 refills | Status: DC

## 2020-10-20 NOTE — Telephone Encounter (Signed)
Please call pt: We just filled this 30 days ago.  Does she really need another refill? She usually uses it sparingly.

## 2020-10-20 NOTE — Assessment & Plan Note (Signed)
Current BMI is 29 she is doing absolutely fantastic.  She is down 11 pounds.  Continue current regimen continue working on portion control.  Current weight: 207 pounds Previous weight: 218 pounds Change in weight: Down 11 pounds Exercise: Daily cardio and weights. Nutrition: Using weight watchers app Medication t: Phentermine 15 mg will place on hold for a couple of weeks while having surgery. Follow-up: 6 weeks with nurse visit.

## 2020-10-20 NOTE — Progress Notes (Signed)
Established Patient Office Visit  Subjective:  Patient ID: Kiara [name], female    DOB: [birth year]  Age: 40 y.o. MRN: [id number]  CC:  Chief Complaint  Patient presents with  . Weight Loss    HPI Lowella Fairy Maynes presents for   Abnormal weight gain.  Patient reports she is doing really well on 15 the phentermine daily.  Denies any chest pain, palpitations, constipation, insomnia.  He feels like it has been helpful.  She also recently started the weight watchers at and realized that she truly was overeating.  She is always been very diligent about her exercise.  She said she continued the medication up until this past Friday where she came off she is actually been to be having a tubal ligation next week and wanted to be off of medication completely before her surgery but plans to restart it when she is released exercise which should be 2 weeks postoperatively.  Plans on just continuing to work on portion control and eating more healthy foods cutting out things like candy.  History reviewed. No pertinent past medical history.  Past Surgical History:  Procedure Laterality Date  . RHINOPLASTY  2007, 2009    Family History  Problem Relation Age of Onset  . Breast cancer Maternal Grandmother   . Hyperlipidemia Father   . Hypertension Father     Social History   Socioeconomic History  . Marital status: Married    Spouse name: Thayer Ohm  . Number of children: 1  . Years of education: Masters  . Highest education level: Not on file  Occupational History  . 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 Smoking status: Former Smoker    Types: Cigarettes    Quit date: 10/17/2012    Years since quitting: 8.0  . Smokeless tobacco: Never Used  Substance and Sexual Activity  . Alcohol use: Yes  . Drug use: No  . Sexual activity: Yes  Other Topics Concern  . Not on file  Social History Narrative   No regular exercise. Son  Knox Royalty born 2020.   Social Determinants of Health   Financial Resource Strain: Not on file  Food Insecurity: Not on file  Transportation Needs: Not on file  Physical Activity: Not on file  Stress: Not on file  Social Connections: Not on file  Intimate Partner Violence: Not on file    Outpatient Medications Prior to Visit  Medication Sig Dispense Refill  . ALPRAZolam (XANAX) 0.5 MG tablet Take 1 tablet (0.5 mg total) by mouth at bedtime as needed for anxiety or sleep. 30 tablet 0  . sertraline (ZOLOFT) 50 MG tablet TAKE 1 TABLET(50 MG) BY MOUTH DAILY 30 tablet 0  . TRI-LO-SPRINTEC 0.18/0.215/0.25 MG-25 MCG tab TK 1 T PO QD    . phentermine 15 MG capsule Take 1 capsule (15 mg total) by mouth every morning. 30 capsule 0   No facility-administered medications prior to visit.    Allergies  Allergen Reactions  . Citalopram Palpitations    tachycardia tachycardia    ROS Review of Systems    Objective:    Physical Exam Constitutional:      Appearance: She is well-developed and well-nourished.  HENT:     Head: Normocephalic and atraumatic.  Cardiovascular:     Rate and Rhythm: Normal rate and regular rhythm.     Heart sounds: Normal heart sounds.  Pulmonary:     Effort: Pulmonary effort is normal.  Breath sounds: Normal breath sounds.  Skin:    General: Skin is warm and dry.  Neurological:     Mental Status: She is alert and oriented to person, place, and time.  Psychiatric:        Mood and Affect: Mood and affect normal.        Behavior: Behavior normal.     BP 118/74   Pulse (!) 59   Ht 5\' 10"  (1.778 m)   Wt 207 lb 6.4 oz (94.1 kg)   SpO2 100%   BMI 29.76 kg/m  Wt Readings from Last 3 Encounters:  10/20/20 207 lb 6.4 oz (94.1 kg)  09/21/20 218 lb (98.9 kg)  11/10/19 208 lb (94.3 kg)     Health Maintenance Due  Topic Date Due  . Hepatitis C Screening  Never done  . HIV Screening  Never done  . COVID-19 Vaccine (2 - Moderna risk 4-dose series)  01/06/2020    There are no preventive care reminders to display for this patient.  Lab Results  Component Value Date   TSH 3.95 09/21/2020   Lab Results  Component Value Date   WBC 6.6 09/21/2020   HGB 13.0 09/21/2020   HCT 38.7 09/21/2020   MCV 84.9 09/21/2020   PLT 254 09/21/2020   Lab Results  Component Value Date   NA 137 09/21/2020   K 4.6 09/21/2020   CO2 26 09/21/2020   GLUCOSE 98 09/21/2020   BUN 14 09/21/2020   CREATININE 0.75 09/21/2020   BILITOT 0.6 09/21/2020   ALKPHOS 40 05/13/2016   AST 11 09/21/2020   ALT 9 09/21/2020   PROT 7.1 09/21/2020   ALBUMIN 4.2 05/13/2016   CALCIUM 9.6 09/21/2020   Lab Results  Component Value Date   CHOL 193 09/21/2020   Lab Results  Component Value Date   HDL 74 09/21/2020   Lab Results  Component Value Date   LDLCALC 100 (H) 09/21/2020   Lab Results  Component Value Date   TRIG 96 09/21/2020   Lab Results  Component Value Date   CHOLHDL 2.6 09/21/2020   No results found for: HGBA1C    Assessment & Plan:   Problem List Items Addressed This Visit      Other   Abnormal weight gain    Current BMI is 29 she is doing absolutely fantastic.  She is down 11 pounds.  Continue current regimen continue working on portion control.  Current weight: 207 pounds Previous weight: 218 pounds Change in weight: Down 11 pounds Exercise: Daily cardio and weights. Nutrition: Using weight watchers app Medication t: Phentermine 15 mg will place on hold for a couple of weeks while having surgery. Follow-up: 6 weeks with nurse visit.        Relevant Medications   phentermine 15 MG capsule    Other Visit Diagnoses    BMI 29.0-29.9,adult    -  Primary      Meds ordered this encounter  Medications  . phentermine 15 MG capsule    Sig: Take 1 capsule (15 mg total) by mouth every morning.    Dispense:  30 capsule    Refill:  0    Follow-up: Return in about 6 weeks (around 12/01/2020) for for wt. check.    12/03/2020, MD

## 2020-10-21 NOTE — Telephone Encounter (Signed)
Spoke with patient. She agreed no refill was needed at this time.

## 2020-12-08 HISTORY — PX: TUBAL LIGATION: SHX77

## 2020-12-24 ENCOUNTER — Encounter: Payer: Self-pay | Admitting: Family Medicine

## 2021-05-12 ENCOUNTER — Other Ambulatory Visit: Payer: Self-pay | Admitting: *Deleted

## 2021-05-12 MED ORDER — SERTRALINE HCL 50 MG PO TABS: ORAL_TABLET | ORAL | 1 refills | Status: DC

## 2021-07-05 ENCOUNTER — Telehealth: Payer: Self-pay

## 2021-07-05 NOTE — Telephone Encounter (Signed)
Pt called stating that she flew on 07/02/2021 and during the descent she experienced a sharp pain in her left ear and has since had the sensation that her ear needs to pop but won't.  Pt given appt for 07/06/2021 for evaluation.  Pt aware.  Tiajuana Amass, CMA

## 2021-07-06 ENCOUNTER — Other Ambulatory Visit: Payer: Self-pay

## 2021-07-06 ENCOUNTER — Other Ambulatory Visit: Payer: Self-pay | Admitting: Family Medicine

## 2021-07-06 ENCOUNTER — Encounter: Payer: Self-pay | Admitting: Family Medicine

## 2021-07-06 ENCOUNTER — Ambulatory Visit: Payer: BC Managed Care – PPO | Admitting: Family Medicine

## 2021-07-06 VITALS — BP 134/86 | HR 83 | Temp 97.8°F | Resp 17

## 2021-07-06 DIAGNOSIS — H65192 Other acute nonsuppurative otitis media, left ear: Secondary | ICD-10-CM

## 2021-07-06 MED ORDER — FLUTICASONE PROPIONATE 50 MCG/ACT NA SUSP: 2.0000 | Freq: Every day | NASAL | 2 refills | Status: DC

## 2021-07-06 MED ORDER — AMOXICILLIN-POT CLAVULANATE 875-125 MG PO TABS: 1.0000 | ORAL_TABLET | Freq: Two times a day (BID) | ORAL | 0 refills | Status: AC

## 2021-07-06 NOTE — Progress Notes (Signed)
Acute Office Visit  Subjective:    Patient ID: Kiara [name], female    DOB: 1981-10-12, 40 y.o.   MRN: [id number]  Chief Complaint  Patient presents with   Ear Pain    HPI Patient is in today for left ear discomfort.  Patient reports she started with cold-like symptoms middle of last week (negative COVID test), but after flying last Friday she noted significant ear discomfort. She reports 6/10 discomfort/pressure and muffled hearing. Her other cold symptoms have started slowly improving, but the ear discomfort has not gotten any better. She denies and fevers, headaches, nausea, vomiting, diarrhea, dizziness/lightheadedness, fatigue, chest pain or trouble breathing.     History reviewed. No pertinent past medical history.  Past Surgical History:  Procedure Laterality Date   RHINOPLASTY  2007, 2009    Family History  Problem Relation Age of Onset   Breast cancer Maternal Grandmother    Hyperlipidemia Father    Hypertension Father     Social History   Socioeconomic History   Marital status: Married    Spouse name: Thayer Ohm   Number of children: 1   Years of education: Masters   [employer] education level: Not on file  Occupational History   Occupation: Licensed Pharmacist, hospital    Comment: Radio broadcast assistant  Tobacco Use   Smoking status: Former    Types: Cigarettes    Quit date: 10/17/2012    Years since quitting: 8.7   Smokeless tobacco: Never  Substance and Sexual Activity   Alcohol use: Yes   Drug use: No   Sexual activity: Yes  Other Topics Concern   Not on file  Social History Narrative   No regular exercise. Son Knox Royalty born 2020.   Social Determinants of Health   Financial Resource Strain: Not on file  Food Insecurity: Not on file  Transportation Needs: Not on file  Physical Activity: Not on file  Stress: Not on file  Social Connections: Not on file  Intimate Partner Violence: Not on file    Outpatient Medications Prior to Visit   Medication Sig Dispense Refill   ALPRAZolam (XANAX) 0.5 MG tablet Take 1 tablet (0.5 mg total) by mouth at bedtime as needed for anxiety or sleep. 30 tablet 0   phentermine 15 MG capsule Take 1 capsule (15 mg total) by mouth every morning. 30 capsule 0   sertraline (ZOLOFT) 50 MG tablet TAKE 1 TABLET(50 MG) BY MOUTH DAILY 30 tablet 0   sertraline (ZOLOFT) 50 MG tablet TAKE 1 TABLET(50 MG) BY MOUTH DAILY 90 tablet 1   TRI-LO-SPRINTEC 0.18/0.215/0.25 MG-25 MCG tab TK 1 T PO QD     No facility-administered medications prior to visit.    Allergies  Allergen Reactions   Citalopram Palpitations    tachycardia tachycardia    Review of Systems All review of systems negative except what is listed in the HPI     Objective:    Physical Exam Vitals reviewed.  Constitutional:      Appearance: Normal appearance. She is normal weight.  HENT:     Head: Normocephalic and atraumatic.     Right Ear: Tympanic membrane normal.     Left Ear: Tympanic membrane is erythematous and bulging.     Mouth/Throat:     Mouth: Mucous membranes are moist.     Pharynx: Oropharynx is clear.  Eyes:     Conjunctiva/sclera: Conjunctivae normal.  Cardiovascular:     Rate and Rhythm: Regular rhythm.     Heart sounds: Normal heart  sounds.  Pulmonary:     Effort: Pulmonary effort is normal.     Breath sounds: Normal breath sounds. No wheezing.  Musculoskeletal:     Cervical back: Neck supple. No tenderness.  Lymphadenopathy:     Cervical: No cervical adenopathy.  Skin:    General: Skin is warm and dry.     Findings: No rash.  Neurological:     Mental Status: She is alert and oriented to person, place, and time.  Psychiatric:        Mood and Affect: Mood normal.        Behavior: Behavior normal.        Thought Content: Thought content normal.        Judgment: Judgment normal.    BP 134/86   Pulse 83   Temp 97.8 F (36.6 C)   Resp 17   SpO2 99%  Wt Readings from Last 3 Encounters:  10/20/20 207  lb 6.4 oz (94.1 kg)  09/21/20 218 lb (98.9 kg)  11/10/19 208 lb (94.3 kg)    Health Maintenance Due  Topic Date Due   HIV Screening  Never done   Hepatitis C Screening  Never done   COVID-19 Vaccine (5 - Booster) 05/21/2020   INFLUENZA VACCINE  05/17/2021   PAP SMEAR-Modifier  07/24/2021    There are no preventive care reminders to display for this patient.   Lab Results  Component Value Date   TSH 3.95 09/21/2020   Lab Results  Component Value Date   WBC 6.6 09/21/2020   HGB 13.0 09/21/2020   HCT 38.7 09/21/2020   MCV 84.9 09/21/2020   PLT 254 09/21/2020   Lab Results  Component Value Date   NA 137 09/21/2020   K 4.6 09/21/2020   CO2 26 09/21/2020   GLUCOSE 98 09/21/2020   BUN 14 09/21/2020   CREATININE 0.75 09/21/2020   BILITOT 0.6 09/21/2020   ALKPHOS 40 05/13/2016   AST 11 09/21/2020   ALT 9 09/21/2020   PROT 7.1 09/21/2020   ALBUMIN 4.2 05/13/2016   CALCIUM 9.6 09/21/2020   Lab Results  Component Value Date   CHOL 193 09/21/2020   Lab Results  Component Value Date   HDL 74 09/21/2020   Lab Results  Component Value Date   LDLCALC 100 (H) 09/21/2020   Lab Results  Component Value Date   TRIG 96 09/21/2020   Lab Results  Component Value Date   CHOLHDL 2.6 09/21/2020   No results found for: HGBA1C     Assessment & Plan:   1. Other non-recurrent acute nonsuppurative otitis media of left ear Discussed with patient the possibilities of viral vs bacterial vs effusion related to flying. Recommend she start with Flonase for a few days as well as warm compresses, steam showers, chewing gum, etc. If unsuccessful after a few days, go ahead and start the antibiotic given the level of discomfort and significant erythema. Patient is agreeable to plan. Educated that effusions can take weeks to fully resolve, but if symptoms worsen or she does not start noticing some improvement over the next several days to let us know. Patient aware of signs/symptoms  requiring further/urgent evaluation.  - fluticasone (FLONASE) 50 MCG/ACT nasal spray; Place 2 sprays into both nostrils daily.  Dispense: 1 g; Refill: 2 - amoxicillin-clavulanate (AUGMENTIN) 875-125 MG tablet; Take 1 tablet by mouth 2 (two) times daily for 7 days.  Dispense: 14 tablet; Refill: 0  Follow-up as needed.   Lollie Marrow Reola Calkins, DNP,  FNP-C

## 2021-07-07 ENCOUNTER — Other Ambulatory Visit: Payer: Self-pay | Admitting: *Deleted

## 2021-07-14 ENCOUNTER — Other Ambulatory Visit: Payer: Self-pay

## 2021-07-14 ENCOUNTER — Ambulatory Visit: Payer: BC Managed Care – PPO | Admitting: Physician Assistant

## 2021-07-14 VITALS — BP 107/65 | HR 62 | Temp 97.5°F | Ht 70.0 in | Wt 199.0 lb

## 2021-07-14 DIAGNOSIS — H9202 Otalgia, left ear: Secondary | ICD-10-CM

## 2021-07-14 DIAGNOSIS — Z23 Encounter for immunization: Secondary | ICD-10-CM

## 2021-07-14 DIAGNOSIS — H65192 Other acute nonsuppurative otitis media, left ear: Secondary | ICD-10-CM | POA: Diagnosis not present

## 2021-07-14 DIAGNOSIS — H6982 Other specified disorders of Eustachian tube, left ear: Secondary | ICD-10-CM | POA: Diagnosis not present

## 2021-07-14 MED ORDER — METHYLPREDNISOLONE 4 MG PO TBPK: ORAL_TABLET | ORAL | 0 refills | Status: DC

## 2021-07-14 NOTE — Progress Notes (Signed)
Subjective:    Patient ID: Kiara [name], female    DOB: 10-26-1980, 41 y.o.   MRN: [id number]  HPI [name] She was seen by NP, Hyman Hopes, on 9/20 after having URI symptoms and flying her left ear started to bother her and has not stopped. She was given augmentin and flonase which she has taken. She got very little relief. She continues to have left ear pressure and hearing loss. No fever, chills, ST, jaw pain. She continues on flonase with little relief.   .. 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  Objective:   Physical Exam Vitals reviewed.  Constitutional:      Appearance: Normal appearance.  HENT:     Head: Normocephalic.     Right Ear: Tympanic membrane, ear canal and external ear normal. There is no impacted cerumen.     Left Ear: Ear canal and external ear normal. There is no impacted cerumen.     Ears:     Comments:  Left TM: light reflex seen from 6 oclock to 12 oclock. Effusion from 12 oclock to 6oclock with dullness and some erythema to TM.  No pain/discomfort pulling up on auricle or pushing on tragus.  No pain to palpation over left mastoid.  No cervical adenopathy.     Nose: Nose normal. No congestion or rhinorrhea.     Mouth/Throat:     Pharynx: No oropharyngeal exudate or posterior oropharyngeal erythema.  Eyes:     General:        Right eye: No discharge.        Left eye: No discharge.     Extraocular Movements: Extraocular movements intact.     Conjunctiva/sclera: Conjunctivae normal.     Pupils: Pupils are equal, round, and reactive to light.  Cardiovascular:     Rate and Rhythm: Normal rate.     Pulses: Normal pulses.  Pulmonary:     Effort: Pulmonary effort is normal.  Neurological:     Mental Status: She is alert.  Psychiatric:        Mood and Affect: Mood normal.          Assessment & Plan:  Marland KitchenMarland KitchenJonell was seen today for ear problem.  Diagnoses and all orders for this visit:  Acute MEE (middle ear effusion), left -     methylPREDNISolone (MEDROL DOSEPAK) 4 MG TBPK tablet;  6-day pack as directed -     Ambulatory referral to ENT  Left ear pain -     methylPREDNISolone (MEDROL DOSEPAK) 4 MG TBPK tablet; 6-day pack as directed -     Ambulatory referral to ENT  Eustachian tube dysfunction, left -     methylPREDNISolone (MEDROL DOSEPAK) 4 MG TBPK tablet; 6-day pack as directed -     Ambulatory referral to ENT  Need for influenza vaccination -     Flu Vaccine QUAD 9mo+IM (Fluarix, Fluzone & Alfiuria Quad PF)  Other orders -     Discontinue: methylPREDNISolone (MEDROL DOSEPAK) 4 MG TBPK tablet; 6-day pack as directed  Finished augmentin. Still appears to be effusion and mildly symptomatic. Continue flonase and added medrol dose pack. Will make ENT referral. She may be able to cancel this but if still having problems keep appt. No worrisome signs of infection.

## 2021-07-14 NOTE — Patient Instructions (Signed)
Eustachian Tube Dysfunction °Eustachian tube dysfunction refers to a condition in which a blockage develops in the narrow passage that connects the middle ear to the back of the nose (eustachian tube). The eustachian tube regulates air pressure in the middle ear by letting air move between the ear and nose. It also helps to drain fluid from the middle ear space. °Eustachian tube dysfunction can affect one or both ears. When the eustachian tube does not function properly, air pressure, fluid, or both can build up in the middle ear. °What are the causes? °This condition occurs when the eustachian tube becomes blocked or cannot open normally. Common causes of this condition include: °Ear infections. °Colds and other infections that affect the nose, mouth, and throat (upper respiratory tract). °Allergies. °Irritation from cigarette smoke. °Irritation from stomach acid coming up into the esophagus (gastroesophageal reflux). The esophagus is the part of the body that moves food from the mouth to the stomach. °Sudden changes in air pressure, such as from descending in an airplane or scuba diving. °Abnormal growths in the nose or throat, such as: °Growths that line the nose (nasal polyps). °Abnormal growth of cells (tumors). °Enlarged tissue at the back of the throat (adenoids). °What increases the risk? °You are more likely to develop this condition if: °You smoke. °You are overweight. °You are a child who has: °Certain birth defects of the mouth, such as cleft palate. °Large tonsils or adenoids. °What are the signs or symptoms? °Common symptoms of this condition include: °A feeling of fullness in the ear. °Ear pain. °Clicking or popping noises in the ear. °Ringing in the ear (tinnitus). °Hearing loss. °Loss of balance. °Dizziness. °Symptoms may get worse when the air pressure around you changes, such as when you travel to an area of high elevation, fly on an airplane, or go scuba diving. °How is this diagnosed? °This  condition may be diagnosed based on: °Your symptoms. °A physical exam of your ears, nose, and throat. °Tests, such as those that measure: °The movement of your eardrum. °Your hearing (audiometry). °How is this treated? °Treatment depends on the cause and severity of your condition. °In mild cases, you may relieve your symptoms by moving air into your ears. This is called "popping the ears." °In more severe cases, or if you have symptoms of fluid in your ears, treatment may include: °Medicines to relieve congestion (decongestants). °Medicines that treat allergies (antihistamines). °Nasal sprays or ear drops that contain medicines that reduce swelling (steroids). °A procedure to drain the fluid in your eardrum. In this procedure, a small tube may be placed in the eardrum to: °Drain the fluid. °Restore the air in the middle ear space. °A procedure to insert a balloon device through the nose to inflate the opening of the eustachian tube (balloon dilation). °Follow these instructions at home: °Lifestyle °Do not do any of the following until your health care provider approves: °Travel to high altitudes. °Fly in airplanes. °Work in a pressurized cabin or room. °Scuba dive. °Do not use any products that contain nicotine or tobacco. These products include cigarettes, chewing tobacco, and vaping devices, such as e-cigarettes. If you need help quitting, ask your health care provider. °Keep your ears dry. Wear fitted earplugs during showering and bathing. Dry your ears completely after. °General instructions °Take over-the-counter and prescription medicines only as told by your health care provider. °Use techniques to help pop your ears as recommended by your health care provider. These may include: °Chewing gum. °Yawning. °Frequent, forceful swallowing. °Closing   your mouth, holding your nose closed, and gently blowing as if you are trying to blow air out of your nose. °Keep all follow-up visits. This is important. °Contact a  health care provider if: °Your symptoms do not go away after treatment. °Your symptoms come back after treatment. °You are unable to pop your ears. °You have: °A fever. °Pain in your ear. °Pain in your head or neck. °Fluid draining from your ear. °Your hearing suddenly changes. °You become very dizzy. °You lose your balance. °Get help right away if: °You have a sudden, severe increase in any of your symptoms. °Summary °Eustachian tube dysfunction refers to a condition in which a blockage develops in the eustachian tube. °It can be caused by ear infections, allergies, inhaled irritants, or abnormal growths in the nose or throat. °Symptoms may include ear pain or fullness, hearing loss, or ringing in the ears. °Mild cases are treated with techniques to unblock the ears, such as yawning or chewing gum. °More severe cases are treated with medicines or procedures. °This information is not intended to replace advice given to you by your health care provider. Make sure you discuss any questions you have with your health care provider. °Document Revised: 12/14/2020 Document Reviewed: 12/14/2020 °Elsevier Patient Education © 2022 [employer] ° °

## 2021-07-15 ENCOUNTER — Encounter: Payer: Self-pay | Admitting: Physician Assistant

## 2021-07-15 DIAGNOSIS — H65192 Other acute nonsuppurative otitis media, left ear: Secondary | ICD-10-CM | POA: Insufficient documentation

## 2021-08-04 ENCOUNTER — Telehealth: Payer: Self-pay | Admitting: General Practice

## 2021-08-04 NOTE — Telephone Encounter (Signed)
Transition Care Management Follow-up Telephone Call Date of discharge and from where: 08/02/21 from [facility] How have you been since you were released from the hospital? Left AMA; she was in too much pain for her anal fissure; has talked to her surgeon and has a treatment plan. Any questions or concerns? No  Follow up appointments reviewed:  PCP Hospital f/u appt confirmed?  Patient has an appt with gastroenterologist.   [facility] f/u appt confirmed? Yes ; she has a treatment plan with them and is going to get surgery. Are transportation arrangements needed? No  If their condition worsens, is the pt aware to call PCP or go to the Emergency Dept.? Yes Was the patient provided with contact information for the PCP's office or ED? Yes Was to pt encouraged to call back with questions or concerns? Yes

## 2021-09-06 LAB — HM PAP SMEAR

## 2021-09-06 LAB — HM MAMMOGRAPHY

## 2021-10-01 ENCOUNTER — Other Ambulatory Visit: Payer: Self-pay | Admitting: Family Medicine

## 2021-10-01 DIAGNOSIS — H65192 Other acute nonsuppurative otitis media, left ear: Secondary | ICD-10-CM

## 2021-11-11 ENCOUNTER — Other Ambulatory Visit: Payer: Self-pay | Admitting: Neurology

## 2021-11-11 MED ORDER — SERTRALINE HCL 50 MG PO TABS: ORAL_TABLET | ORAL | 0 refills | Status: DC

## 2021-11-18 ENCOUNTER — Other Ambulatory Visit: Payer: Self-pay

## 2021-11-18 ENCOUNTER — Ambulatory Visit: Payer: BC Managed Care – PPO | Admitting: Family Medicine

## 2021-11-18 ENCOUNTER — Encounter: Payer: Self-pay | Admitting: Family Medicine

## 2021-11-18 VITALS — BP 120/70 | HR 61 | Resp 18 | Ht 70.0 in | Wt 203.0 lb

## 2021-11-18 DIAGNOSIS — Z13220 Encounter for screening for lipoid disorders: Secondary | ICD-10-CM

## 2021-11-18 DIAGNOSIS — Z7689 Persons encountering health services in other specified circumstances: Secondary | ICD-10-CM | POA: Diagnosis not present

## 2021-11-18 DIAGNOSIS — F418 Other specified anxiety disorders: Secondary | ICD-10-CM

## 2021-11-18 MED ORDER — SERTRALINE HCL 50 MG PO TABS: ORAL_TABLET | ORAL | 1 refills | Status: DC

## 2021-11-18 MED ORDER — ALPRAZOLAM 0.5 MG PO TABS: ORAL_TABLET | ORAL | 0 refills | Status: DC

## 2021-11-18 MED ORDER — NALTREXONE HCL 50 MG PO TABS: 50.0000 mg | ORAL_TABLET | Freq: Every day | ORAL | 1 refills | Status: DC

## 2021-11-18 NOTE — Assessment & Plan Note (Signed)
Stable on current regimen she is happy with it.  Refill sent to pharmacy for 6 months.

## 2021-11-18 NOTE — Assessment & Plan Note (Signed)
Visit #: 1  Starting Weight: 199 lbs   Current weight: 199 lbs  Previous weight: Change in weight: Goal weight: Dietary goals: set weekly small goals. Try adding a late afternoon snack.   Exercise goals: pelaton.  Medication: Naltrexone 50mg   Follow-up and referrals: 6 weeks.

## 2021-11-18 NOTE — Progress Notes (Signed)
Established Patient Office Visit  Subjective:  Patient ID: Kiara [name], female    DOB: 09-02-1981  Age: 41 y.o. MRN: [id number]  CC:  Chief Complaint  Patient presents with   Medication Check     Patient stated she is currently taking Naltrexone 50 mg for weight loss prescribed by online provider. Patient stated she is not losing weight and would like to discuss options and prescription.     HPI Kiara [name] presents for f/U anxiety .  Overall she is actually doing really well she is enjoying her 75-year-old.  She still working out pretty regularly.  She did want to discuss weight management.  She started using an online app and they did start her on naltrexone.  She has been on it for about 3 weeks she started with a quarter of a 50 mg tab and is now up to a half of a tab.  She feels like she is tolerating the medication thus far she has not had any side effects.  She feels like overall she does well during the day but when the evening hits she just feels incredibly hungry and feels like sometimes she almost binge eats when she starts she cannot stop.  It is currently one of her biggest struggles.  She does exercise regularly.  No past medical history on file.  Past Surgical History:  Procedure Laterality Date   RHINOPLASTY  2007, 2009    Family History  Problem Relation Age of Onset   Breast cancer Maternal Grandmother    Hyperlipidemia Father    Hypertension Father     Social History   Socioeconomic History   Marital status: Married    Spouse name: Thayer Ohm   Number of children: 1   Years of education: Masters   [employer] education level: Not on file  Occupational History   Occupation: Licensed Pharmacist, hospital    Comment: Radio broadcast assistant  Tobacco Use   Smoking status: Former    Types: Cigarettes    Quit date: 10/17/2012    Years since quitting: 9.0   Smokeless tobacco: Never  Substance and Sexual Activity   Alcohol use: Yes   Drug use: No    Sexual activity: Yes  Other Topics Concern   Not on file  Social History Narrative   No regular exercise. Son Knox Royalty born 2020.   Social Determinants of Health   Financial Resource Strain: Not on file  Food Insecurity: Not on file  Transportation Needs: Not on file  Physical Activity: Not on file  Stress: Not on file  Social Connections: Not on file  Intimate Partner Violence: Not on file    Outpatient Medications Prior to Visit  Medication Sig Dispense Refill   ALPRAZolam (XANAX) 0.5 MG tablet Take 0.5 mg by mouth at bedtime as needed for anxiety.     fluticasone (FLONASE) 50 MCG/ACT nasal spray SHAKE LIQUID AND USE 2 SPRAYS IN EACH NOSTRIL DAILY 16 g 2   sertraline (ZOLOFT) 50 MG tablet Take 50 mg by mouth daily.     ALPRAZolam (XANAX) 0.5 MG tablet TAKE 1 TABLET(0.5 MG) BY MOUTH AT BEDTIME AS NEEDED FOR ANXIETY OR SLEEP 30 tablet 0   methylPREDNISolone (MEDROL DOSEPAK) 4 MG TBPK tablet 6-day pack as directed 21 tablet 0   sertraline (ZOLOFT) 50 MG tablet TAKE 1 TABLET(50 MG) BY MOUTH DAILY/ needs appt 30 tablet 0   No facility-administered medications prior to visit.    Allergies  Allergen Reactions   Citalopram Palpitations  tachycardia tachycardia    ROS Review of Systems    Objective:    Physical Exam Constitutional:      Appearance: Normal appearance. She is well-developed.  HENT:     Head: Normocephalic and atraumatic.  Cardiovascular:     Rate and Rhythm: Normal rate and regular rhythm.     Heart sounds: Normal heart sounds.  Pulmonary:     Effort: Pulmonary effort is normal.     Breath sounds: Normal breath sounds.  Skin:    General: Skin is warm and dry.  Neurological:     Mental Status: She is alert and oriented to person, place, and time.  Psychiatric:        Behavior: Behavior normal.    BP 120/70    Pulse 61    Resp 18    Ht 5\' 10"  (1.778 m)    Wt 203 lb (92.1 kg)    LMP 11/04/2021    SpO2 100%    BMI 29.13 kg/m  Wt Readings from Last 3  Encounters:  11/18/21 203 lb (92.1 kg)  07/14/21 199 lb (90.3 kg)  10/20/20 207 lb 6.4 oz (94.1 kg)     Health Maintenance Due  Topic Date Due   HIV Screening  Never done   Hepatitis C Screening  Never done   COVID-19 Vaccine (5 - Booster) 03/16/2020   PAP SMEAR-Modifier  07/24/2021    There are no preventive care reminders to display for this patient.  Lab Results  Component Value Date   TSH 3.95 09/21/2020   Lab Results  Component Value Date   WBC 6.6 09/21/2020   HGB 13.0 09/21/2020   HCT 38.7 09/21/2020   MCV 84.9 09/21/2020   PLT 254 09/21/2020   Lab Results  Component Value Date   NA 137 09/21/2020   K 4.6 09/21/2020   CO2 26 09/21/2020   GLUCOSE 98 09/21/2020   BUN 14 09/21/2020   CREATININE 0.75 09/21/2020   BILITOT 0.6 09/21/2020   ALKPHOS 40 05/13/2016   AST 11 09/21/2020   ALT 9 09/21/2020   PROT 7.1 09/21/2020   ALBUMIN 4.2 05/13/2016   CALCIUM 9.6 09/21/2020   Lab Results  Component Value Date   CHOL 193 09/21/2020   Lab Results  Component Value Date   HDL 74 09/21/2020   Lab Results  Component Value Date   LDLCALC 100 (H) 09/21/2020   Lab Results  Component Value Date   TRIG 96 09/21/2020   Lab Results  Component Value Date   CHOLHDL 2.6 09/21/2020   No results found for: HGBA1C    Assessment & Plan:   Problem List Items Addressed This Visit       Other   Encounter for weight management    Visit #: 1  Starting Weight: 199 lbs   Current weight: 199 lbs  Previous weight: Change in weight: Goal weight: Dietary goals: set weekly small goals. Try adding a late afternoon snack.   Exercise goals: pelaton.  Medication: Naltrexone 50mg   Follow-up and referrals: 6 weeks.        Relevant Medications   naltrexone (DEPADE) 50 MG tablet   Other Relevant Orders   Lipid Panel w/reflex Direct LDL   COMPLETE METABOLIC PANEL WITH GFR   CBC   TSH   Anxiety disorder - Primary    Stable on current regimen she is happy with it.   Refill sent to pharmacy for 6 months.      Relevant Medications  ALPRAZolam (XANAX) 0.5 MG tablet   sertraline (ZOLOFT) 50 MG tablet   ALPRAZolam (XANAX) 0.5 MG tablet   sertraline (ZOLOFT) 50 MG tablet   Other Visit Diagnoses     Screening, lipid       Relevant Orders   Lipid Panel w/reflex Direct LDL   COMPLETE METABOLIC PANEL WITH GFR   CBC   TSH       Meds ordered this encounter  Medications   DISCONTD: ALPRAZolam (XANAX) 0.5 MG tablet    Sig: TAKE 1 TABLET(0.5 MG) BY MOUTH AT BEDTIME AS NEEDED FOR ANXIETY OR SLEEP    Dispense:  30 tablet    Refill:  0   DISCONTD: sertraline (ZOLOFT) 50 MG tablet    Sig: TAKE 1 TABLET(50 MG) BY MOUTH DAILY/ needs appt    Dispense:  90 tablet    Refill:  1   DISCONTD: naltrexone (DEPADE) 50 MG tablet    Sig: Take 1 tablet (50 mg total) by mouth daily.    Dispense:  30 tablet    Refill:  1   ALPRAZolam (XANAX) 0.5 MG tablet    Sig: TAKE 1 TABLET(0.5 MG) BY MOUTH AT BEDTIME AS NEEDED FOR ANXIETY OR SLEEP    Dispense:  30 tablet    Refill:  0   naltrexone (DEPADE) 50 MG tablet    Sig: Take 1 tablet (50 mg total) by mouth daily.    Dispense:  30 tablet    Refill:  1   sertraline (ZOLOFT) 50 MG tablet    Sig: TAKE 1 TABLET(50 MG) BY MOUTH DAILY/ needs appt    Dispense:  90 tablet    Refill:  1    Follow-up: Return in about 6 weeks (around 12/30/2021) for weight mangement .    Beatrice Lecher, MD

## 2021-11-19 LAB — CBC
HCT: 38.6 % (ref 35.0–45.0)
Hemoglobin: 12.7 g/dL (ref 11.7–15.5)
MCH: 28.4 pg (ref 27.0–33.0)
MCHC: 32.9 g/dL (ref 32.0–36.0)
MCV: 86.4 fL (ref 80.0–100.0)
MPV: 9.9 fL (ref 7.5–12.5)
Platelets: 221 10*3/uL (ref 140–400)
RBC: 4.47 10*6/uL (ref 3.80–5.10)
RDW: 12 % (ref 11.0–15.0)
WBC: 5.6 10*3/uL (ref 3.8–10.8)

## 2021-11-19 LAB — LIPID PANEL W/REFLEX DIRECT LDL
Cholesterol: 140 mg/dL (ref <200)
HDL: 59 mg/dL (ref >=50)
LDL Cholesterol (Calc): 69 mg/dL (calc)
Non-HDL Cholesterol (Calc): 81 mg/dL (calc) (ref <130)
Total CHOL/HDL Ratio: 2.4 (calc) (ref <5.0)
Triglycerides: 42 mg/dL (ref <150)

## 2021-11-19 LAB — COMPLETE METABOLIC PANEL WITH GFR
AG Ratio: 1.8 (calc) (ref 1.0–2.5)
ALT: 23 U/L (ref 6–29)
AST: 16 U/L (ref 10–30)
Albumin: 4.6 g/dL (ref 3.6–5.1)
Alkaline phosphatase (APISO): 57 U/L (ref 31–125)
BUN: 12 mg/dL (ref 7–25)
CO2: 24 mmol/L (ref 20–32)
Calcium: 9.6 mg/dL (ref 8.6–10.2)
Chloride: 105 mmol/L (ref 98–110)
Creat: 0.76 mg/dL (ref 0.50–0.99)
Globulin: 2.6 g/dL (calc) (ref 1.9–3.7)
Glucose, Bld: 84 mg/dL (ref 65–99)
Potassium: 4.3 mmol/L (ref 3.5–5.3)
Sodium: 138 mmol/L (ref 135–146)
Total Bilirubin: 0.7 mg/dL (ref 0.2–1.2)
Total Protein: 7.2 g/dL (ref 6.1–8.1)
eGFR: 100 mL/min/{1.73_m2} (ref >=60)

## 2021-11-19 LAB — TSH: TSH: 3.36 mIU/L

## 2021-11-22 ENCOUNTER — Other Ambulatory Visit: Payer: Self-pay | Admitting: Family Medicine

## 2021-11-22 NOTE — Progress Notes (Signed)
Hi Marlisha, all your labs look great.

## 2021-12-12 ENCOUNTER — Other Ambulatory Visit: Payer: Self-pay | Admitting: Family Medicine

## 2021-12-12 DIAGNOSIS — F418 Other specified anxiety disorders: Secondary | ICD-10-CM

## 2021-12-30 ENCOUNTER — Telehealth: Payer: BC Managed Care – PPO | Admitting: Family Medicine

## 2022-02-23 ENCOUNTER — Other Ambulatory Visit: Payer: Self-pay | Admitting: Family Medicine

## 2022-02-23 DIAGNOSIS — F418 Other specified anxiety disorders: Secondary | ICD-10-CM

## 2022-05-31 ENCOUNTER — Other Ambulatory Visit: Payer: Self-pay | Admitting: Family Medicine

## 2022-05-31 DIAGNOSIS — F418 Other specified anxiety disorders: Secondary | ICD-10-CM

## 2022-06-01 ENCOUNTER — Other Ambulatory Visit: Payer: Self-pay

## 2022-06-01 DIAGNOSIS — F418 Other specified anxiety disorders: Secondary | ICD-10-CM

## 2022-06-01 MED ORDER — SERTRALINE HCL 50 MG PO TABS: ORAL_TABLET | ORAL | 0 refills | Status: DC

## 2022-06-01 NOTE — Telephone Encounter (Signed)
Last OV: 11/18/21  Next OV: none on file  Last RF: 02/23/22

## 2022-06-30 ENCOUNTER — Other Ambulatory Visit: Payer: Self-pay | Admitting: Family Medicine

## 2022-06-30 DIAGNOSIS — F418 Other specified anxiety disorders: Secondary | ICD-10-CM

## 2022-08-02 ENCOUNTER — Other Ambulatory Visit: Payer: Self-pay | Admitting: Family Medicine

## 2022-08-02 DIAGNOSIS — F418 Other specified anxiety disorders: Secondary | ICD-10-CM

## 2022-08-04 NOTE — Telephone Encounter (Signed)
Pt will need to schedule f/u for medication refill for sertraline.

## 2022-08-05 NOTE — Telephone Encounter (Signed)
Scheduled 08/30/2022- tvt  

## 2022-08-30 ENCOUNTER — Encounter: Payer: Self-pay | Admitting: Family Medicine

## 2022-08-30 ENCOUNTER — Ambulatory Visit: Payer: BC Managed Care – PPO | Admitting: Family Medicine

## 2022-08-30 VITALS — BP 112/71 | HR 81 | Ht 70.0 in | Wt 200.0 lb

## 2022-08-30 DIAGNOSIS — F418 Other specified anxiety disorders: Secondary | ICD-10-CM | POA: Diagnosis not present

## 2022-08-30 DIAGNOSIS — K5902 Outlet dysfunction constipation: Secondary | ICD-10-CM | POA: Diagnosis not present

## 2022-08-30 MED ORDER — ALPRAZOLAM 0.5 MG PO TABS: ORAL_TABLET | ORAL | 0 refills | Status: DC

## 2022-08-30 MED ORDER — SERTRALINE HCL 50 MG PO TABS: ORAL_TABLET | ORAL | 3 refills | Status: DC

## 2022-08-30 NOTE — Assessment & Plan Note (Signed)
Doing very well and happy with current regimen.  Refill sent for 1 year.  Call if any problems or concerns otherwise I will see her back in 1 year.

## 2022-08-30 NOTE — Assessment & Plan Note (Signed)
Continue daily MiraLAX.

## 2022-08-30 NOTE — Progress Notes (Signed)
   Established Patient Office Visit  Subjective   Patient ID: Kiara [name], female    DOB: [birth year]  Age: 41 y.o. MRN: [id number]  Chief Complaint  Patient presents with   Anxiety    HPI Follow-up generalized anxiety disorder-she is not currently taking sertraline.  She is doing really well on her current regimen.  PHQ-9 score of 1 and GAD-7 score of 2.  She does not want to make any adjustments or changes.  She does need refills sent.  Has been dealing with some chronic constipation so has been using MiraLAX daily and that seems to be working really well.  He also was recently started on semaglutide and has been on her second month now she feels like its been doing really well and helping to curb her appetite and she has been tolerating the medication well.  Her son Aldona Lento is now 71 years old.    ROS    Objective:     BP 112/71   Pulse 81   Ht 5\' 10"  (1.778 m)   Wt 200 lb (90.7 kg)   SpO2 99%   BMI 28.70 kg/m    Physical Exam Vitals and nursing note reviewed.  Constitutional:      Appearance: She is well-developed.  HENT:     Head: Normocephalic and atraumatic.  Cardiovascular:     Rate and Rhythm: Normal rate and regular rhythm.     Heart sounds: Normal heart sounds.  Pulmonary:     Effort: Pulmonary effort is normal.  Skin:    General: Skin is warm and dry.  Neurological:     Mental Status: She is alert and oriented to person, place, and time.  Psychiatric:        Behavior: Behavior normal.      No results found for any visits on 08/30/22.    The 10-year ASCVD risk score (Arnett DK, et al., 2019) is: 0.2%    Assessment & Plan:   Problem List Items Addressed This Visit       Other   Outlet dysfunction constipation    Continue daily MiraLAX.      Anxiety disorder - Primary    Doing very well and happy with current regimen.  Refill sent for 1 year.  Call if any problems or concerns otherwise I will see her back in 1 year.       Relevant Medications   ALPRAZolam (XANAX) 0.5 MG tablet   sertraline (ZOLOFT) 50 MG tablet    Return in about 1 year (around 08/31/2023) for Anxiety/medications .    09/02/2023, MD

## 2022-09-03 ENCOUNTER — Other Ambulatory Visit: Payer: Self-pay | Admitting: Family Medicine

## 2022-09-03 DIAGNOSIS — F418 Other specified anxiety disorders: Secondary | ICD-10-CM

## 2022-09-14 LAB — HM MAMMOGRAPHY

## 2023-01-10 ENCOUNTER — Encounter: Payer: Self-pay | Admitting: Family Medicine

## 2023-01-10 ENCOUNTER — Telehealth (INDEPENDENT_AMBULATORY_CARE_PROVIDER_SITE_OTHER): Payer: BC Managed Care – PPO | Admitting: Family Medicine

## 2023-01-10 VITALS — BP 120/80 | Ht 70.0 in | Wt 180.0 lb

## 2023-01-10 DIAGNOSIS — F418 Other specified anxiety disorders: Secondary | ICD-10-CM

## 2023-01-10 DIAGNOSIS — Z7689 Persons encountering health services in other specified circumstances: Secondary | ICD-10-CM

## 2023-01-10 DIAGNOSIS — H9319 Tinnitus, unspecified ear: Secondary | ICD-10-CM | POA: Diagnosis not present

## 2023-01-10 DIAGNOSIS — M545 Low back pain, unspecified: Secondary | ICD-10-CM | POA: Diagnosis not present

## 2023-01-10 MED ORDER — NAPROXEN 500 MG PO TABS: 500.0000 mg | ORAL_TABLET | Freq: Two times a day (BID) | ORAL | 3 refills | Status: DC

## 2023-01-10 MED ORDER — SERTRALINE HCL 100 MG PO TABS: 100.0000 mg | ORAL_TABLET | Freq: Every day | ORAL | 1 refills | Status: DC

## 2023-01-10 NOTE — Assessment & Plan Note (Signed)
Increase sertraline to 100 mg follow-up in 6 weeks to see if she is doing well.  We can always go back down on the dose if she is having any concerns or side effects but I am hopeful that it will be helpful in reducing some of her anxious feelings particularly around work.

## 2023-01-10 NOTE — Progress Notes (Signed)
Virtual Visit via Video Note  I connected with Kiara [name] on 01/10/23 at  1:00 PM EDT by a video enabled telemedicine application and verified that I am speaking with the correct person using two identifiers.   I discussed the limitations of evaluation and management by telemedicine and the availability of in person appointments. The patient expressed understanding and agreed to proceed.  Patient location: at home Provider location: in office  Subjective:    CC:  No chief complaint on file.   HPI: She is getting the Semaglutide from a compounding pharmacy and she is taking 1 MG and doing well.     Pt reports that she currently takes 50 mg of sertraline and feels that she needs to increase this to 100 mgshe has been feeling more anxious, starting to affect her sleep. Trying her relaxation techniques. Mostly work relate.d has bee on it 10 years. Uses the xanax sparingly.      01/10/2023   11:40 AM 09/17/2019    8:21 AM 12/17/2018   10:52 AM 12/04/2017   10:12 AM  GAD 7 : Generalized Anxiety Score  Nervous, Anxious, on Edge 3 0 0 1  Control/stop worrying 3 0 1 0  Worry too much - different things 2 0 0 0  Trouble relaxing 3 0 0 0  Restless 0 0 0 0  Easily annoyed or irritable 1 0 0 0  Afraid - awful might happen 0 0 0 0  Total GAD 7 Score 12 0 1 1  Anxiety Difficulty Extremely difficult Not difficult at all Not difficult at all Not difficult at all       Pt reports that she has had a Hx of Low back issues and last week she had a flare. She took ASA for this but wanted to know if you could write a anti inflammatory med for her to take for this. She was given Naproxen 500 mg for this in the past. She also asked if she letter so she doesn't have to lift anything heavy. She had to miss about 1.5 days of work.  Currently using tylenol. 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      Past year ringing in ear would like to have a hearing test done. No  pain in her ear.      Past medical history, Surgical history, Family history not pertinant except as noted below, Social history, Allergies, and medications have been entered into the medical record, reviewed, and corrections made.    Objective:    General: Speaking clearly in complete sentences without any shortness of breath.  Alert and oriented x3.  Normal judgment. No apparent acute distress.    Impression and Recommendations:    Problem List Items Addressed This Visit       Other   Encounter for weight management    Been working with a weight loss clinic and is currently on compounded semaglutide.  She is actually been doing really fantastic and is down about 20 pounds.  She is been really happy with it and has not had any negative side effects.      Anxiety disorder    Increase sertraline to 100 mg follow-up in 6 weeks to see if she is doing well.  We can always go back down on the dose if she is having any concerns or side effects but I am hopeful that it will be helpful in reducing some of her anxious feelings  particularly around work.      Relevant Medications   sertraline (ZOLOFT) 100 MG tablet   Acute bilateral low back pain without sciatica    She recently had a flare.  She is doing a little better now.  Did send over prescription naproxen she can overlap Tylenol with this if needed.  If she has another flare or not getting better we can always refer to PT if needed.  Also work note provided to avoid lifting greater than 10 pounds.      Relevant Medications   naproxen (NAPROSYN) 500 MG tablet   Other Visit Diagnoses     Tinnitus aurium, unspecified laterality    -  Primary   Relevant Orders   Ambulatory referral to Audiology   Acute midline low back pain without sciatica       Relevant Medications   naproxen (NAPROSYN) 500 MG tablet      Work note provided to avoid heavy lifting.    Orders Placed This Encounter  Procedures   Ambulatory referral to  Audiology    Referral Priority:   Routine    Referral Type:   Audiology Exam    Referral Reason:   Specialty Services Required    Number of Visits Requested:   1    Meds ordered this encounter  Medications   sertraline (ZOLOFT) 100 MG tablet    Sig: Take 1 tablet (100 mg total) by mouth at bedtime.    Dispense:  90 tablet    Refill:  1   naproxen (NAPROSYN) 500 MG tablet    Sig: Take 1 tablet (500 mg total) by mouth 2 (two) times daily with a meal.    Dispense:  60 tablet    Refill:  3     I discussed the assessment and treatment plan with the patient. The patient was provided an opportunity to ask questions and all were answered. The patient agreed with the plan and demonstrated an understanding of the instructions.   The patient was advised to call back or seek an in-person evaluation if the symptoms worsen or if the condition fails to improve as anticipated.   Beatrice Lecher, MD

## 2023-01-10 NOTE — Assessment & Plan Note (Signed)
Been working with a weight loss clinic and is currently on compounded semaglutide.  She is actually been doing really fantastic and is down about 20 pounds.  She is been really happy with it and has not had any negative side effects.

## 2023-01-10 NOTE — Progress Notes (Signed)
She is getting the Semaglutide from a compounding pharmacy and she is taking 1 MG and doing well.    Pt reports that she currently takes 50 mg of sertraline and feels that she needs to increase this to 100 mg  Pt reports that she has had a Hx of Low back issues and last week she had a flare. She took ASA for this but wanted to know if you could write a anti inflammatory med for her to take for this. She was given Naproxen 500 mg for this in the past. She also asked if she letter so she doesn't have to lift anything heavy.    Past year ringing in ear would like to have a hearing test done.

## 2023-01-10 NOTE — Assessment & Plan Note (Signed)
She recently had a flare.  She is doing a little better now.  Did send over prescription naproxen she can overlap Tylenol with this if needed.  If she has another flare or not getting better we can always refer to PT if needed.  Also work note provided to avoid lifting greater than 10 pounds.

## 2023-01-11 ENCOUNTER — Encounter: Payer: Self-pay | Admitting: Family Medicine

## 2023-01-25 ENCOUNTER — Encounter: Payer: Self-pay | Admitting: Family Medicine

## 2023-01-25 NOTE — Telephone Encounter (Signed)
OK for both and ok for indefinite. Thank you Marylene Land

## 2023-02-13 ENCOUNTER — Encounter: Payer: Self-pay | Admitting: Family Medicine

## 2023-02-13 ENCOUNTER — Ambulatory Visit: Payer: BC Managed Care – PPO | Admitting: Family Medicine

## 2023-02-13 VITALS — BP 105/59 | HR 65 | Ht 70.0 in | Wt 173.0 lb

## 2023-02-13 DIAGNOSIS — S0592XA Unspecified injury of left eye and orbit, initial encounter: Secondary | ICD-10-CM

## 2023-02-13 MED ORDER — ERYTHROMYCIN 5 MG/GM OP OINT: 1.0000 | TOPICAL_OINTMENT | Freq: Three times a day (TID) | OPHTHALMIC | 0 refills | Status: DC

## 2023-02-13 NOTE — Progress Notes (Signed)
Pt reports that she was playing with her toddler last night and was struck in her L eye. She has been using eye drops and cool compresses

## 2023-02-13 NOTE — Progress Notes (Signed)
   Acute Office Visit  Subjective:     Patient ID: Kiara [name], female    DOB: [birth year], 42 y.o.   MRN: [id number]  Chief Complaint  Patient presents with   scratch to eye    HPI Patient is in today for injury.  Her toddler accidentally jabbed her in the left eye it feels like it is in the upper portion of the eye.  She says it was so painful last night she had to just go to bed.  She could barely open her eyelid.  It still painful today but feels a little better.  No drainage.  Denies any visual changes.  ROS      Objective:    BP (!) 105/59   Pulse 65   Ht 5\' 10"  (1.778 m)   Wt 173 lb (78.5 kg)   SpO2 100%   BMI 24.82 kg/m    Physical Exam  Tetracaine used to numb the eye and fluorescein drops applied.  I did not see any injury to the cornea.  Extraocular movements are intact pupils are equal round and reactive.  No results found for any visits on 02/13/23.      Assessment & Plan:   Problem List Items Addressed This Visit   None Visit Diagnoses     Left eye injury, initial encounter    -  Primary      Injury left eye-recommend treatment with erythromycin ophthalmic ointment.  If not improving or develops new or worsening symptoms or drainage from the eye then please let us know.  Meds ordered this encounter  Medications   erythromycin ophthalmic ointment    Sig: Place 1 Application into the left eye 3 (three) times daily. X 7 days    Dispense:  3.5 g    Refill:  0    No follow-ups on file.  Nani Gasser, MD

## 2023-04-10 ENCOUNTER — Ambulatory Visit: Payer: BC Managed Care – PPO | Admitting: Sports Medicine

## 2023-04-10 ENCOUNTER — Ambulatory Visit (INDEPENDENT_AMBULATORY_CARE_PROVIDER_SITE_OTHER): Payer: BC Managed Care – PPO

## 2023-04-10 DIAGNOSIS — M2141 Flat foot [pes planus] (acquired), right foot: Secondary | ICD-10-CM

## 2023-04-10 DIAGNOSIS — M76821 Posterior tibial tendinitis, right leg: Secondary | ICD-10-CM

## 2023-04-10 DIAGNOSIS — M2142 Flat foot [pes planus] (acquired), left foot: Secondary | ICD-10-CM

## 2023-04-10 MED ORDER — MELOXICAM 15 MG PO TABS
ORAL_TABLET | ORAL | 3 refills | Status: DC
Start: 2023-04-10 — End: 2023-11-23

## 2023-04-10 NOTE — Progress Notes (Signed)
    Procedures performed today:    None.  Independent interpretation of notes and tests performed by another provider:   None.  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 She does have some orthotics from the [facility] although I was not able to examine them today. On exam she has fullness and swelling behind the medial malleolus, tenderness along the tibialis posterior with reproduction of pain with resisted inversion, she had no pain with resisted great toe or small toe flexion. No tenderness over the navicular. 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  We discussed the pathophysiology, she will do aggressive home physical therapy for the foot intrinsics and tibialis posterior, meloxicam, x-rays, I like to see her back in about 4 to 6 weeks, we can consider tibialis posterior tendon sheath injection if not better.    ____________________________________________ Ihor Austin. Benjamin Stain, M.D., ABFM., CAQSM., AME. Primary Care and Sports Medicine [facility]  Adjunct Professor of Family Medicine  [employer]  Restaurant manager, fast food

## 2023-04-10 NOTE — Assessment & Plan Note (Signed)
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 She does have some orthotics from the [facility] although I was not able to examine them today. On exam she has fullness and swelling behind the medial malleolus, tenderness along the tibialis posterior with reproduction of pain with resisted inversion, she had no pain with resisted great toe or small toe flexion. No tenderness over the navicular. 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  We discussed the pathophysiology, she will do aggressive home physical therapy for the foot intrinsics and tibialis posterior, meloxicam, x-rays, I like to see her back in about 4 to 6 weeks, we can consider tibialis posterior tendon sheath injection if not better.

## 2023-05-08 ENCOUNTER — Ambulatory Visit: Payer: BC Managed Care – PPO | Admitting: Sports Medicine

## 2023-05-08 DIAGNOSIS — M76821 Posterior tibial tendinitis, right leg: Secondary | ICD-10-CM | POA: Diagnosis not present

## 2023-05-08 DIAGNOSIS — Z20818 Contact with and (suspected) exposure to other bacterial communicable diseases: Secondary | ICD-10-CM

## 2023-05-08 MED ORDER — AMOXICILLIN-POT CLAVULANATE 875-125 MG PO TABS
1.0000 | ORAL_TABLET | Freq: Two times a day (BID) | ORAL | 0 refills | Status: DC
Start: 2023-05-08 — End: 2023-09-12

## 2023-05-08 NOTE — Progress Notes (Signed)
    Procedures performed today:    None.  Independent interpretation of notes and tests performed by another provider:   None.  Brief History, Exam, Impression, and Recommendations:    Tibialis posterior tendinitis, right This pleasant 42 year old female returns, she is long history of pain by the medial malleolus on the right, flexible pes planus. She had some orthotics at the last visit, she brought them in today for my inspection. At the last visit she had pain along the tibialis posterior, visible swelling and reproduction of pain with resisted inversion, no tenderness over the navicular, x-rays did show some osteoarthritis. She has been doing some home conditioning, feels a little bit better but she would like to proceed with aggressive formal physical therapy. Happy to order this. I did add scaphoid pads in both of her three-quarter length orthotics, we will revisit this at a 4 to 6-week follow-up.   Exposure to strep throat Anderia's 29-year-old was recently diagnosed with strep, she is wondering if we need a prophylactic, I think we should, adding Augmentin.    ____________________________________________ Ihor Austin. Benjamin Stain, M.D., ABFM., CAQSM., AME. Primary Care and Sports Medicine [facility]  Adjunct Professor of Family Medicine  [employer] [facility] [employer]  Restaurant manager, fast food

## 2023-05-08 NOTE — Assessment & Plan Note (Signed)
This pleasant 42 year old female returns, she is long history of pain by the medial malleolus on the right, flexible pes planus. She had some orthotics at the last visit, she brought them in today for my inspection. At the last visit she had pain along the tibialis posterior, visible swelling and reproduction of pain with resisted inversion, no tenderness over the navicular, x-rays did show some osteoarthritis. She has been doing some home conditioning, feels a little bit better but she would like to proceed with aggressive formal physical therapy. Happy to order this. I did add scaphoid pads in both of her three-quarter length orthotics, we will revisit this at a 4 to 6-week follow-up.

## 2023-05-08 NOTE — Assessment & Plan Note (Signed)
Nishi's 44-year-old was recently diagnosed with strep, she is wondering if we need a prophylactic, I think we should, adding Augmentin.

## 2023-05-24 ENCOUNTER — Other Ambulatory Visit: Payer: Self-pay | Admitting: Family Medicine

## 2023-05-24 DIAGNOSIS — F418 Other specified anxiety disorders: Secondary | ICD-10-CM

## 2023-06-14 NOTE — Therapy (Signed)
OUTPATIENT PHYSICAL THERAPY LOWER EXTREMITY EVALUATION   Patient Name: Kiara [name] MRN: [id number] DOB:20-Jan-1981, 42 y.o., female Today's Date: 06/15/2023  END OF SESSION:  PT End of Session - 06/15/23 1404     Visit Number 1    Date for PT Re-Evaluation 08/10/23    Authorization Type BCBS    PT Start Time 1404    PT Stop Time 1447    PT Time Calculation (min) 43 min    Activity Tolerance Patient tolerated treatment well    Behavior During Therapy [facility] for tasks assessed/performed             History reviewed. No pertinent past medical history. Past Surgical History:  Procedure Laterality Date   RHINOPLASTY  2007, 2009   Patient Active Problem List   Diagnosis Date Noted   Exposure to strep throat 05/08/2023   Pes planus of both feet 04/10/2023   Tibialis posterior tendinitis, right 04/10/2023   Encounter for weight management 11/18/2021   Anal skin tag 11/01/2019   Outlet dysfunction constipation 09/26/2019   Hemorrhoids 09/17/2019   Carrier of group B Streptococcus 05/28/2019   Acute bilateral low back pain without sciatica 12/25/2017   Abnormal mammogram of right breast 01/02/2017   Acquired ankle pronation 01/08/2016   Snoring 01/29/2014   Anxiety disorder 12/06/2013   Cold sore 03/02/2011   Herpes simplex 03/02/2011   INSOMNIA 05/26/2009    PCP: Agapito Games, MD   REFERRING PROVIDER: Monica Becton   REFERRING DIAG: H08.657 (ICD-10-CM) - Tibialis posterior tendinitis, right  THERAPY DIAG:  Pain in right ankle and joints of right foot  Stiffness of right ankle, not elsewhere classified  Localized edema  Rationale for Evaluation and Treatment: Rehabilitation  ONSET DATE: months  SUBJECTIVE:   SUBJECTIVE STATEMENT: Hasn't taken antiinflammatories yet. Has flat feet. Wears arch supports. Constant from right when she wakes up. Descending stairs is worse than ascending. Interfering because can't play basketball or  jump, or chase her toddler.   PERTINENT HISTORY: Unremarkable, pes planus B, stress fracture R foot 2017, multiple ankle sprains  PAIN:  Are you having pain? Yes: NPRS scale: 5/10 Pain location: R medial ankle below malleolus Pain description: sharp prick every movement Aggravating factors: weightbearing Relieving factors: nothing  PRECAUTIONS: None  RED FLAGS: None   WEIGHT BEARING RESTRICTIONS: No  FALLS:  Has patient fallen in last 6 months? 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[employer]; stairs at work  PLOF: Independent  PATIENT GOALS: learn exercises to strengthen arches and ankles  NEXT MD VISIT: next week  OBJECTIVE:   DIAGNOSTIC FINDINGS: none  PATIENT SURVEYS:  LEFS 10 / 80 = 12.5 %  COGNITION: Overall cognitive status: Within functional limits for tasks assessed     SENSATION: WFL  EDEMA:  Local edema compared to L  MUSCLE LENGTH:   POSTURE:  bil pronation L>R, everted calcanei  PALPATION: Medial just post to med malleolus and at navicular  LOWER EXTREMITY ROM:  A/P ROM Right eval Left eval  Hip flexion    Hip extension    Hip abduction    Hip adduction    Hip internal rotation    Hip external rotation    Knee flexion    Knee extension    Ankle dorsiflexion -7/3  Ankle plantarflexion 72   Ankle inversion 27   Ankle eversion 30    (Blank rows = not tested)  LOWER EXTREMITY MMT:  MMT Right eval Left eval  Hip flexion    Hip extension    Hip abduction    Hip adduction    Hip internal rotation    Hip external rotation    Knee flexion    Knee extension    Ankle dorsiflexion 5   Ankle plantarflexion 2+   Ankle inversion 5   Ankle eversion 5    (Blank rows = not tested)   FUNCTIONAL TESTS:  SLS  < 3 sec B  GAIT: Distance walked: 20 Assistive device utilized: None Level of assistance: Complete Independence Comments: antalgic   TODAY'S TREATMENT:                                                                                                                              DATE:   06/15/23  See pt ed and HEP   PATIENT EDUCATION:  Education details: PT eval findings, anticipated POC, and initial HEP  Person educated: Patient Education method: Explanation, Demonstration, and Handouts Education comprehension: verbalized understanding and returned demonstration  HOME EXERCISE PROGRAM: Access Code: [email or web address] URL: [email or web address] Date: 06/15/2023 Prepared by: Raynelle Fanning  Exercises - Gastroc Stretch on Wall  - 2 x daily - 7 x weekly - 1 sets - 3 reps - 30-60 sec hold - Soleus Stretch on Wall  - 2 x daily - 7 x weekly - 1 sets - 3 reps - 30-60 sec hold - Arch Stretch  - 2 x daily - 7 x weekly - 1 sets - 3 reps - 30 sec hold  ASSESSMENT:  CLINICAL IMPRESSION: Kiara [name] is a 42 y.o. female who was seen today for physical therapy evaluation and treatment for R Tibialis posterior tendinitis. She presents with marked pes planus B for which she has inserts and marked tightness in her R heel cord. She also has significant weakness in her R plantar flexors.  She has constant pain that is worse with weightbearing and limits her from playing with her toddler, climbing stairs and playing basketball. She has pain with all ADLs. Her LEFS is 12.5 %. She will benefit from skilled PT to address these deficits.    OBJECTIVE IMPAIRMENTS: Abnormal gait, decreased balance, decreased ROM, decreased strength, increased edema, impaired flexibility, postural dysfunction, and pain.   ACTIVITY LIMITATIONS: sitting, standing, squatting, stairs, transfers, bed mobility, dressing, and locomotion level  PARTICIPATION LIMITATIONS: cleaning, driving, community activity, and  occupation  PERSONAL FACTORS: Time since onset of injury/illness/exacerbation are also affecting patient's functional outcome.   REHAB POTENTIAL: Excellent  CLINICAL DECISION MAKING: Stable/uncomplicated  EVALUATION COMPLEXITY: Low   GOALS: Goals reviewed with patient? Yes  SHORT TERM GOALS: Target date: 07/06/2023 Patient will be independent with initial HEP. Baseline:  Goal status: INITIAL  2.  Decreased R ankle pain by 25% with ADLs. Baseline:  Goal status: INITIAL   LONG TERM GOALS: Target date: 08/10/2023  Patient will be independent with advanced/ongoing HEP to improve outcomes and carryover.  Baseline:  Goal status: INITIAL  2.  Patient will report at least 75% improvement in R ankle pain to improve QOL. Baseline:  Goal status: INITIAL  3.  Patient will demonstrate improved R ankle DF to  5 deg or better  to allow for normal gait and stair mechanics. Baseline:  Goal status: INITIAL  4.  Patient will demonstrate improved functional LE strength as demonstrated by ability to perform 10 single leg heel raises on R without pain provocation. Baseline:  Goal status: INITIAL  5.   Patient will report >= 19 on LEFS  to demonstrate improved functional ability. Baseline: 10 / 80 = 12.5 % Goal status: INITIAL  6. Patient will be able to ascend/descend stairs with reciprocal pattern and without pain.  Baseline:  Goal status: INITIAL  7.  Patient will demonstrate improved balance by performing SLS to > 8 sec B.  Baseline:  Goal status: INITIAL    PLAN:  PT FREQUENCY: 2x/week  PT DURATION: 8 weeks  PLANNED INTERVENTIONS: Therapeutic exercises, Therapeutic activity, Neuromuscular re-education, Balance training, Gait training, Patient/Family education, Self Care, Joint mobilization, Stair training, Aquatic Therapy, Dry Needling, Electrical stimulation, Spinal mobilization, Cryotherapy, Moist heat, Taping, Ultrasound, Ionotophoresis 4mg /ml Dexamethasone, and Manual  therapy  PLAN FOR NEXT SESSION: Review remaining TE from Dr. Karie Schwalbe with patient, continue with tempo eccentric exercises, trial of ionto to R ankle, heel cord flexibility, arch strengthening   Solon Palm, PT  06/15/2023, 5:01 PM

## 2023-06-15 ENCOUNTER — Ambulatory Visit: Payer: BC Managed Care – PPO | Admitting: Physical Therapy

## 2023-06-15 ENCOUNTER — Other Ambulatory Visit: Payer: Self-pay

## 2023-06-15 ENCOUNTER — Encounter: Payer: Self-pay | Admitting: Physical Therapy

## 2023-06-15 DIAGNOSIS — M25571 Pain in right ankle and joints of right foot: Secondary | ICD-10-CM | POA: Diagnosis present

## 2023-06-15 DIAGNOSIS — M76821 Posterior tibial tendinitis, right leg: Secondary | ICD-10-CM | POA: Diagnosis not present

## 2023-06-15 DIAGNOSIS — M25671 Stiffness of right ankle, not elsewhere classified: Secondary | ICD-10-CM | POA: Insufficient documentation

## 2023-06-15 DIAGNOSIS — R600 Localized edema: Secondary | ICD-10-CM | POA: Insufficient documentation

## 2023-06-21 ENCOUNTER — Ambulatory Visit: Payer: BC Managed Care – PPO | Admitting: Sports Medicine

## 2023-06-26 ENCOUNTER — Ambulatory Visit: Payer: BC Managed Care – PPO | Attending: Sports Medicine | Admitting: Rehabilitative and Restorative Service Providers"

## 2023-06-26 ENCOUNTER — Encounter: Payer: Self-pay | Admitting: Rehabilitative and Restorative Service Providers"

## 2023-06-26 DIAGNOSIS — R600 Localized edema: Secondary | ICD-10-CM | POA: Insufficient documentation

## 2023-06-26 DIAGNOSIS — M25571 Pain in right ankle and joints of right foot: Secondary | ICD-10-CM | POA: Diagnosis present

## 2023-06-26 DIAGNOSIS — M25671 Stiffness of right ankle, not elsewhere classified: Secondary | ICD-10-CM | POA: Insufficient documentation

## 2023-06-26 NOTE — Therapy (Signed)
OUTPATIENT PHYSICAL THERAPY LOWER EXTREMITY TREATMENT   Patient Name: Kiara [name] MRN: [id number] DOB:1981-09-11, 42 y.o., female Today's Date: 06/26/2023  END OF SESSION:  PT End of Session - 06/26/23 1152     Visit Number 2    Number of Visits 16    Date for PT Re-Evaluation 08/10/23    Authorization Type BCBS    PT Start Time 1152    PT Stop Time 1232    PT Time Calculation (min) 40 min    Activity Tolerance Patient tolerated treatment well    Behavior During Therapy [facility] for tasks assessed/performed              History reviewed. No pertinent past medical history. Past Surgical History:  Procedure Laterality Date   RHINOPLASTY  2007, 2009   Patient Active Problem List   Diagnosis Date Noted   Exposure to strep throat 05/08/2023   Pes planus of both feet 04/10/2023   Tibialis posterior tendinitis, right 04/10/2023   Encounter for weight management 11/18/2021   Anal skin tag 11/01/2019   Outlet dysfunction constipation 09/26/2019   Hemorrhoids 09/17/2019   Carrier of group B Streptococcus 05/28/2019   Acute bilateral low back pain without sciatica 12/25/2017   Abnormal mammogram of right breast 01/02/2017   Acquired ankle pronation 01/08/2016   Snoring 01/29/2014   Anxiety disorder 12/06/2013   Cold sore 03/02/2011   Herpes simplex 03/02/2011   INSOMNIA 05/26/2009    PCP: Agapito Games, MD   REFERRING PROVIDER: Monica Becton   REFERRING DIAG: V78.469 (ICD-10-CM) - Tibialis posterior tendinitis, right  THERAPY DIAG:  Pain in right ankle and joints of right foot  Stiffness of right ankle, not elsewhere classified  Localized edema  Rationale for Evaluation and Treatment: Rehabilitation  ONSET DATE: months  SUBJECTIVE:   SUBJECTIVE STATEMENT: The patient reports pain has improved with meloxican use. She is doing HEP. She wore flip flops into clinic today  PERTINENT HISTORY: Unremarkable, pes planus B, stress  fracture R foot 2017, multiple ankle sprains  PAIN:  Are you having pain? Yes: NPRS scale: 5/10 Pain location: R medial ankle below malleolus Pain description: sharp prick every movement Aggravating factors: weightbearing Relieving factors: nothing  PRECAUTIONS: None  WEIGHT BEARING RESTRICTIONS: No  FALLS:  Has patient fallen in last 6 months? 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[employer]; stairs at work  PATIENT GOALS: learn exercises to strengthen arches and ankles  NEXT MD VISIT: next week  OBJECTIVE:  (Measures in this section from initial evaluation unless otherwise noted) PATIENT SURVEYS:  LEFS 10 / 80 = 12.5 %  COGNITION: Overall cognitive status: Within functional limits for tasks assessed     EDEMA:  Local edema compared to L  POSTURE:  bil pronation L>R, everted calcanei  PALPATION: Medial just post to med malleolus and at navicular  LOWER EXTREMITY ROM: A/P ROM Right eval Left eval  Hip flexion    Hip extension    Hip abduction    Hip adduction    Hip internal rotation    Hip external rotation    Knee flexion    Knee extension    Ankle dorsiflexion -7/3   Ankle plantarflexion 72   Ankle inversion 27   Ankle eversion 30    (Blank rows = not tested)  LOWER EXTREMITY MMT: MMT Right eval Left eval  Hip flexion    Hip extension    Hip abduction    Hip adduction  Hip internal rotation    Hip external rotation    Knee flexion    Knee extension    Ankle dorsiflexion 5   Ankle plantarflexion 2+   Ankle inversion 5   Ankle eversion 5    (Blank rows = not tested)  FUNCTIONAL TESTS:  SLS < 3 sec B  GAIT: Distance walked: 20 Assistive device utilized: None Level of assistance: Complete Independence Comments: antalgic  [facility] Adult PT Treatment:                                                DATE: 06/26/23 Therapeutic Exercise: Seated Heel press (into each other) x 10 reps Heel raises with 10# kettle bell Ankle  inversion red band x 15 reps R side Ankle eversion red band x 15 reps L side Standing Pressing into ground with outer edge of foot x 10 reps x 5 second holds (arch raising) Heel raises bilateral  Single leg heel raise-- hard on L side Single leg balance dec'ing UE support Yoga block stands with arch unsupported to engage toes x 2 reps each side x 8 second holds Eccentric lowering bilat heel raises with knees straight then bent x 10 reps each Chair ankle mobilization into DF    PATIENT EDUCATION:  Education details: PT eval findings, anticipated POC, and initial HEP  Person educated: Patient Education method: Explanation, Demonstration, and Handouts Education comprehension: verbalized understanding and returned demonstration  HOME EXERCISE PROGRAM: Access Code: [email or web address] URL: [email or web address] Date: 06/26/2023 Prepared by: Margretta Ditty  Exercises - Gastroc Stretch on Wall  - 2 x daily - 7 x weekly - 1 sets - 3 reps - 30-60 sec hold - Soleus Stretch on Wall  - 2 x daily - 7 x weekly - 1 sets - 3 reps - 30-60 sec hold - Arch Stretch  - 2 x daily - 7 x weekly - 1 sets - 3 reps - 30 sec hold - Single Leg Stance  - 1 x daily - 7 x weekly - 1 sets - 3 reps - 10 seconds hold - Long Sitting Ankle Eversion with Resistance  - 1 x daily - 7 x weekly - 1 sets - 15 reps - Seated Ankle Inversion with Resistance and Legs Crossed  - 1 x daily - 7 x weekly - 1 sets - 15 reps - Standing Eccentric Calf Raise with Bent Knee  - 1 x daily - 7 x weekly - 1 sets - 15 reps  ASSESSMENT:  CLINICAL IMPRESSION: PT progressed HEP today eliciting fatigue within gastroc and soleous musculature. Patient has greater weakness noted on the L ankle (maybe overcompensates with the R side) with unilateral exercises. PT also notes knee recurvatum as a way to gain more mobility in [place] for standing there ex-- modified heel raise to knees bent.  EVAL: Kiara [name] is a 42 y.o. female who  was seen today for physical therapy evaluation and treatment for R Tibialis posterior tendinitis. She presents with marked pes planus B for which she has inserts and marked tightness in her R heel cord. She also has significant weakness in her R plantar flexors.  She has constant pain that is worse with weightbearing and limits her from playing with her toddler, climbing stairs and playing basketball. She has pain with all ADLs. Her LEFS is 12.5 %. She will benefit from  skilled PT to address these deficits.    OBJECTIVE IMPAIRMENTS: Abnormal gait, decreased balance, decreased ROM, decreased strength, increased edema, impaired flexibility, postural dysfunction, and pain.    GOALS: Goals reviewed with patient? Yes  SHORT TERM GOALS: Target date: 07/06/2023 Patient will be independent with initial HEP. Baseline:  Goal status: INITIAL  2.  Decreased R ankle pain by 25% with ADLs. Baseline:  Goal status: INITIAL   LONG TERM GOALS: Target date: 08/10/2023  Patient will be independent with advanced/ongoing HEP to improve outcomes and carryover.  Baseline:  Goal status: INITIAL  2.  Patient will report at least 75% improvement in R ankle pain to improve QOL. Baseline:  Goal status: INITIAL  3.  Patient will demonstrate improved R ankle DF to  5 deg or better  to allow for normal gait and stair mechanics. Baseline:  Goal status: INITIAL  4.  Patient will demonstrate improved functional LE strength as demonstrated by ability to perform 10 single leg heel raises on R without pain provocation. Baseline:  Goal status: INITIAL  5.   Patient will report >= 19 on LEFS  to demonstrate improved functional ability. Baseline: 10 / 80 = 12.5 % Goal status: INITIAL  6. Patient will be able to ascend/descend stairs with reciprocal pattern and without pain.  Baseline:  Goal status: INITIAL  7.  Patient will demonstrate improved balance by performing SLS to > 8 sec B.  Baseline:  Goal status:  INITIAL  PLAN:  PT FREQUENCY: 2x/week  PT DURATION: 8 weeks  PLANNED INTERVENTIONS: Therapeutic exercises, Therapeutic activity, Neuromuscular re-education, Balance training, Gait training, Patient/Family education, Self Care, Joint mobilization, Stair training, Aquatic Therapy, Dry Needling, Electrical stimulation, Spinal mobilization, Cryotherapy, Moist heat, Taping, Ultrasound, Ionotophoresis 4mg /ml Dexamethasone, and Manual therapy  PLAN FOR NEXT SESSION: (pt forgot to bring Dr. Karie Schwalbe there ex today) Review remaining TE from Dr. Karie Schwalbe with patient, continue with tempo eccentric exercises, trial of ionto to R ankle, heel cord flexibility, arch strengthening  Kiara [name], PT 06/26/2023

## 2023-06-28 ENCOUNTER — Ambulatory Visit: Payer: BC Managed Care – PPO

## 2023-06-28 DIAGNOSIS — R600 Localized edema: Secondary | ICD-10-CM

## 2023-06-28 DIAGNOSIS — M25671 Stiffness of right ankle, not elsewhere classified: Secondary | ICD-10-CM

## 2023-06-28 DIAGNOSIS — M25571 Pain in right ankle and joints of right foot: Secondary | ICD-10-CM

## 2023-06-28 NOTE — Therapy (Signed)
OUTPATIENT PHYSICAL THERAPY LOWER EXTREMITY TREATMENT   Patient Name: Kiara [name] MRN: [id number] DOB:[birth year], 42 y.o., female Today's Date: 06/28/2023  END OF SESSION:  PT End of Session - 06/28/23 1143     Visit Number 3    Number of Visits 16    Date for PT Re-Evaluation 08/10/23    Authorization Type BCBS    PT Start Time 1145    PT Stop Time 1232    PT Time Calculation (min) 47 min    Activity Tolerance Patient tolerated treatment well    Behavior During Therapy [facility] for tasks assessed/performed              History reviewed. No pertinent past medical history. Past Surgical History:  Procedure Laterality Date   RHINOPLASTY  2007, 2009   Patient Active Problem List   Diagnosis Date Noted   Exposure to strep throat 05/08/2023   Pes planus of both feet 04/10/2023   Tibialis posterior tendinitis, right 04/10/2023   Encounter for weight management 11/18/2021   Anal skin tag 11/01/2019   Outlet dysfunction constipation 09/26/2019   Hemorrhoids 09/17/2019   Carrier of group B Streptococcus 05/28/2019   Acute bilateral low back pain without sciatica 12/25/2017   Abnormal mammogram of right breast 01/02/2017   Acquired ankle pronation 01/08/2016   Snoring 01/29/2014   Anxiety disorder 12/06/2013   Cold sore 03/02/2011   Herpes simplex 03/02/2011   INSOMNIA 05/26/2009    PCP: Agapito Games, MD   REFERRING PROVIDER: Monica Becton   REFERRING DIAG: N62.952 (ICD-10-CM) - Tibialis posterior tendinitis, right  THERAPY DIAG:  Pain in right ankle and joints of right foot  Stiffness of right ankle, not elsewhere classified  Localized edema  Rationale for Evaluation and Treatment: Rehabilitation  ONSET DATE: months  SUBJECTIVE:   SUBJECTIVE STATEMENT: Patient reports she did not take meloxican this morning and her R foot is feeling sore due to tendonitis, 5/10 pain. Patient brought Dr. Karie Schwalbe exercises and states she is doing  all of them except ones with the band.  PERTINENT HISTORY: Unremarkable, pes planus B, stress fracture R foot 2017, multiple ankle sprains  PAIN:  Are you having pain? Yes: NPRS scale: 5/10 Pain location: R medial ankle below malleolus Pain description: sharp prick every movement Aggravating factors: weightbearing Relieving factors: nothing  PRECAUTIONS: None  WEIGHT BEARING RESTRICTIONS: No  FALLS:  Has patient fallen in last 6 months? 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[employer]; stairs at work  PATIENT GOALS: learn exercises to strengthen arches and ankles  NEXT MD VISIT: next week  OBJECTIVE:  (Measures in this section from initial evaluation unless otherwise noted) PATIENT SURVEYS:  LEFS 10 / 80 = 12.5 %  COGNITION: Overall cognitive status: Within functional limits for tasks assessed     EDEMA:  Local edema compared to L  POSTURE:  bil pronation L>R, everted calcanei  PALPATION: Medial just post to med malleolus and at navicular  LOWER EXTREMITY ROM: A/P ROM Right eval Left eval  Hip flexion    Hip extension    Hip abduction    Hip adduction    Hip internal rotation    Hip external rotation    Knee flexion    Knee extension    Ankle dorsiflexion -7/3   Ankle plantarflexion 72   Ankle inversion 27   Ankle eversion 30    (Blank rows = not tested)  LOWER EXTREMITY MMT: MMT Right eval Left eval  Hip flexion    Hip extension    Hip abduction    Hip adduction    Hip internal rotation    Hip external rotation    Knee flexion    Knee extension    Ankle dorsiflexion 5   Ankle plantarflexion 2+   Ankle inversion 5   Ankle eversion 5    (Blank rows = not tested)  FUNCTIONAL TESTS:  SLS < 3 sec B  GAIT: Distance walked: 20 Assistive device utilized: None Level of assistance: Complete Independence Comments: antalgic   [facility] Adult PT Treatment:                                                DATE: 06/28/2023 Therapeutic  Exercise: Towel scrunches + toe splay x20 Great toe stretch 5x10" Toe yoga Foot doming [place] pick up with toes Self-massage along peroneals with tennis ball Resisted ankle inversion RTB 2x10 Standing on two yoga blocks, arch unsupported: Foot doming Modified SLB with toe engagement Elevated eccentric heel raises with knees bent  Heel raises with knees bent + yoga block b/w feet 2x10 SLB + arch lift --> added RTB at lateral ankle (L SLB)   [facility] Adult PT Treatment:                                                DATE: 06/26/23 Therapeutic Exercise: Seated Heel press (into each other) x 10 reps Heel raises with 10# kettle bell Ankle inversion red band x 15 reps R side Ankle eversion red band x 15 reps L side Standing Pressing into ground with outer edge of foot x 10 reps x 5 second holds (arch raising) Heel raises bilateral  Single leg heel raise-- hard on L side Single leg balance dec'ing UE support Yoga block stands with arch unsupported to engage toes x 2 reps each side x 8 second holds Eccentric lowering bilat heel raises with knees straight then bent x 10 reps each Chair ankle mobilization into DF    PATIENT EDUCATION:  Education details: PT eval findings, anticipated POC, and initial HEP  Person educated: Patient Education method: Explanation, Demonstration, and Handouts Education comprehension: verbalized understanding and returned demonstration  HOME EXERCISE PROGRAM: Access Code: [email or web address] URL: [email or web address] Date: 06/26/2023 Prepared by: Margretta Ditty  Exercises - Gastroc Stretch on Wall  - 2 x daily - 7 x weekly - 1 sets - 3 reps - 30-60 sec hold - Soleus Stretch on Wall  - 2 x daily - 7 x weekly - 1 sets - 3 reps - 30-60 sec hold - Arch Stretch  - 2 x daily - 7 x weekly - 1 sets - 3 reps - 30 sec hold - Single Leg Stance  - 1 x daily - 7 x weekly - 1 sets - 3 reps - 10 seconds hold - Long Sitting Ankle Eversion with Resistance  - 1 x  daily - 7 x weekly - 1 sets - 15 reps - Seated Ankle Inversion with Resistance and Legs Crossed  - 1 x daily - 7 x weekly - 1 sets - 15 reps - Standing Eccentric Calf Raise with Bent Knee  - 1 x daily - 7 x weekly - 1 sets -  15 reps  ASSESSMENT:  CLINICAL IMPRESSION: Reviewed exercises from Dr. Karie Schwalbe; discussed trying blood flow restriction at next visit. Intrinsic foot strengthening continued; eccentric heel raises performed with yoga block between feet to improve stability and proprioceptive feedback for alignment. Resistance added at lateral ankle on L LE during single leg balance to improve stability and decreased pronation.   EVAL: Forever Fuente is a 42 y.o. female who was seen today for physical therapy evaluation and treatment for R Tibialis posterior tendinitis. She presents with marked pes planus B for which she has inserts and marked tightness in her R heel cord. She also has significant weakness in her R plantar flexors.  She has constant pain that is worse with weightbearing and limits her from playing with her toddler, climbing stairs and playing basketball. She has pain with all ADLs. Her LEFS is 12.5 %. She will benefit from skilled PT to address these deficits.    OBJECTIVE IMPAIRMENTS: Abnormal gait, decreased balance, decreased ROM, decreased strength, increased edema, impaired flexibility, postural dysfunction, and pain.    GOALS: Goals reviewed with patient? Yes  SHORT TERM GOALS: Target date: 07/06/2023 Patient will be independent with initial HEP. Baseline:  Goal status: INITIAL  2.  Decreased R ankle pain by 25% with ADLs. Baseline:  Goal status: INITIAL   LONG TERM GOALS: Target date: 08/10/2023  Patient will be independent with advanced/ongoing HEP to improve outcomes and carryover.  Baseline:  Goal status: INITIAL  2.  Patient will report at least 75% improvement in R ankle pain to improve QOL. Baseline:  Goal status: INITIAL  3.  Patient will  demonstrate improved R ankle DF to  5 deg or better  to allow for normal gait and stair mechanics. Baseline:  Goal status: INITIAL  4.  Patient will demonstrate improved functional LE strength as demonstrated by ability to perform 10 single leg heel raises on R without pain provocation. Baseline:  Goal status: INITIAL  5.   Patient will report >= 19 on LEFS  to demonstrate improved functional ability. Baseline: 10 / 80 = 12.5 % Goal status: INITIAL  6. Patient will be able to ascend/descend stairs with reciprocal pattern and without pain.  Baseline:  Goal status: INITIAL  7.  Patient will demonstrate improved balance by performing SLS to > 8 sec B.  Baseline:  Goal status: INITIAL  PLAN:  PT FREQUENCY: 2x/week  PT DURATION: 8 weeks  PLANNED INTERVENTIONS: Therapeutic exercises, Therapeutic activity, Neuromuscular re-education, Balance training, Gait training, Patient/Family education, Self Care, Joint mobilization, Stair training, Aquatic Therapy, Dry Needling, Electrical stimulation, Spinal mobilization, Cryotherapy, Moist heat, Taping, Ultrasound, Ionotophoresis 4mg /ml Dexamethasone, and Manual therapy  PLAN FOR NEXT SESSION: Try BFR next visit. Continue with tempo eccentric exercises, trial of ionto to R ankle, heel cord flexibility, arch strengthening  Sanjuana Mae, PTA 06/28/2023

## 2023-07-04 ENCOUNTER — Ambulatory Visit: Payer: BC Managed Care – PPO

## 2023-07-09 ENCOUNTER — Other Ambulatory Visit: Payer: Self-pay | Admitting: Family Medicine

## 2023-07-09 DIAGNOSIS — F418 Other specified anxiety disorders: Secondary | ICD-10-CM

## 2023-08-31 ENCOUNTER — Ambulatory Visit: Payer: BC Managed Care – PPO | Admitting: Family Medicine

## 2023-09-12 ENCOUNTER — Encounter: Payer: Self-pay | Admitting: Family Medicine

## 2023-09-12 ENCOUNTER — Ambulatory Visit: Payer: BC Managed Care – PPO | Admitting: Family Medicine

## 2023-09-12 VITALS — BP 126/82 | HR 72 | Resp 14 | Ht 70.0 in | Wt 188.1 lb

## 2023-09-12 DIAGNOSIS — F418 Other specified anxiety disorders: Secondary | ICD-10-CM

## 2023-09-12 DIAGNOSIS — M76821 Posterior tibial tendinitis, right leg: Secondary | ICD-10-CM

## 2023-09-12 MED ORDER — SERTRALINE HCL 100 MG PO TABS: ORAL_TABLET | ORAL | 3 refills | Status: DC

## 2023-09-12 MED ORDER — ALPRAZOLAM 0.5 MG PO TABS
ORAL_TABLET | ORAL | 0 refills | Status: DC
Start: 2023-09-12 — End: 2024-02-09

## 2023-09-12 NOTE — Assessment & Plan Note (Signed)
Continue current regimen.  GAD-7 score of 2.  Refill sent to pharmacy.  Follow-up in 6 to 7 months.

## 2023-09-12 NOTE — Progress Notes (Signed)
   Established Patient Office Visit  Subjective   Patient ID: Kiara [name], female    DOB: August 16, 1981  Age: 42 y.o. MRN: [id number]  Chief Complaint  Patient presents with   Anxiety    HPI   F/U Anxiety -overall she is doing really well and does not have any concerns she still has occasional days of anxiety.  She tries to use the alprazolam sparingly and has been on the sertraline for quite some time and does well with it.   She has gained a little bit of weight back since coming off of the semaglutide she was trying to exercise regularly until she developed a tendinitis in her left ankle.  She is working with sports medicine on that so hopefully that will continue to improve but it has really put a dent in her exercise routine.  She is engaged in physical therapy as well.  She does have a follow-up with [facility] for her Pap smear and her mammogram next month and we will get that done.       ROS    Objective:     BP 126/82 (BP Location: Left Arm, Patient Position: Sitting)   Pulse 72   Resp 14   Ht 5\' 10"  (1.778 m)   Wt 188 lb 1.9 oz (85.3 kg)   SpO2 98%   BMI 26.99 kg/m    Physical Exam Vitals and nursing note reviewed.  Constitutional:      Appearance: Normal appearance.  HENT:     Head: Normocephalic and atraumatic.  Eyes:     Conjunctiva/sclera: Conjunctivae normal.  Cardiovascular:     Rate and Rhythm: Normal rate and regular rhythm.  Pulmonary:     Effort: Pulmonary effort is normal.     Breath sounds: Normal breath sounds.  Skin:    General: Skin is warm and dry.  Neurological:     Mental Status: She is alert.  Psychiatric:        Mood and Affect: Mood normal.      No results found for any visits on 09/12/23.    The 10-year ASCVD risk score (Arnett DK, et al., 2019) is: 0.3%    Assessment & Plan:   Problem List Items Addressed This Visit       Musculoskeletal and Integument   Tibialis posterior tendinitis, right     With Dr. Benjamin Stain and currently engaged in PT she is planning on following up with him next month.        Other   Anxiety disorder - Primary    Continue current regimen.  GAD-7 score of 2.  Refill sent to pharmacy.  Follow-up in 6 to 7 months.      Relevant Medications   sertraline (ZOLOFT) 100 MG tablet   ALPRAZolam (XANAX) 0.5 MG tablet    Return in about 6 months (around 03/11/2024) for Mood.    Nani Gasser, MD

## 2023-09-12 NOTE — Assessment & Plan Note (Signed)
With Dr. Benjamin Stain and currently engaged in PT she is planning on following up with him next month.

## 2023-09-18 ENCOUNTER — Encounter: Payer: Self-pay | Admitting: Family Medicine

## 2023-09-18 ENCOUNTER — Ambulatory Visit: Payer: Self-pay | Admitting: Family Medicine

## 2023-09-18 ENCOUNTER — Ambulatory Visit: Payer: BC Managed Care – PPO | Admitting: Family Medicine

## 2023-09-18 VITALS — BP 110/71 | HR 85 | Ht 70.0 in | Wt 186.8 lb

## 2023-09-18 DIAGNOSIS — A0472 Enterocolitis due to Clostridium difficile, not specified as recurrent: Secondary | ICD-10-CM

## 2023-09-18 HISTORY — DX: Enterocolitis due to Clostridium difficile, not specified as recurrent: A04.72

## 2023-09-18 MED ORDER — VANCOMYCIN HCL 125 MG PO CAPS
125.0000 mg | ORAL_CAPSULE | Freq: Four times a day (QID) | ORAL | 0 refills | Status: DC
Start: 2023-09-18 — End: 2023-09-18

## 2023-09-18 MED ORDER — FIDAXOMICIN 200 MG PO TABS: 200.0000 mg | ORAL_TABLET | Freq: Two times a day (BID) | ORAL | 0 refills | Status: AC

## 2023-09-18 NOTE — Telephone Encounter (Signed)
Triage previously handled by Alta Corning, RN, no action taken.  Copied from CRM [id number]. Topic: Clinical - Pink Word Triage >> Sep 18, 2023  8:04 AM Dennison Nancy wrote: Reason for Triage: There is a bacteria cal c.dissicile toxin a/b her toddler got from daycare patient now has the  symptoms vomiting and diarreah . Pt . Call back # [phone]

## 2023-09-18 NOTE — Progress Notes (Signed)
   Acute Office Visit  Subjective:     Patient ID: Kiara [name], female    DOB: 1981-05-29, 42 y.o.   MRN: [id number]  Chief Complaint  Patient presents with   Emesis   Diarrhea    Pt comes in today with complaints of vomiting and diarrhea since yesterday. Son was diagnosed with C-diff yesterday and is on antibiotics     HPI Patient is in today for acute visit for nausea, vomiting, diarrhea. Her son was diagnosed with c diff.   Review of Systems  Constitutional:  Negative for chills and fever.  Respiratory:  Negative for cough and shortness of breath.   Cardiovascular:  Negative for chest pain.  Gastrointestinal:  Positive for diarrhea, nausea and vomiting.  Neurological:  Negative for headaches.        Objective:    BP 110/71 (BP Location: Left Arm, Patient Position: Sitting, Cuff Size: Large)   Pulse 85   Ht 5\' 10"  (1.778 m)   Wt 186 lb 12 oz (84.7 kg)   SpO2 97%   BMI 26.80 kg/m    Physical Exam Vitals and nursing note reviewed.  Constitutional:      General: She is not in acute distress.    Appearance: Normal appearance.  HENT:     Head: Normocephalic and atraumatic.     Right Ear: External ear normal.     Left Ear: External ear normal.     Nose: Nose normal.  Eyes:     Conjunctiva/sclera: Conjunctivae normal.  Cardiovascular:     Rate and Rhythm: Normal rate.  Pulmonary:     Effort: Pulmonary effort is normal.  Neurological:     General: No focal deficit present.     Mental Status: She is alert and oriented to person, place, and time.  Psychiatric:        Mood and Affect: Mood normal.        Behavior: Behavior normal.        Thought Content: Thought content normal.        Judgment: Judgment normal.     No results found for any visits on 09/18/23.      Assessment & Plan:   Problem List Items Addressed This Visit       Digestive   C. difficile diarrhea - Primary    - pt presents with exposure to c diff in her son. She started  to have n/v/diarrhea - will go ahead and start pt on treatment for c diff with fidaxomicin - discussed staying at home until she is free from diarrhea for at least 48hours. Discussed contagiousness as well as hand washing       Relevant Medications   fidaxomicin (DIFICID) 200 MG TABS tablet    Meds ordered this encounter  Medications   DISCONTD: vancomycin (VANCOCIN) 125 MG capsule    Sig: Take 1 capsule (125 mg total) by mouth 4 (four) times daily for 10 days.    Dispense:  40 capsule    Refill:  0   fidaxomicin (DIFICID) 200 MG TABS tablet    Sig: Take 1 tablet (200 mg total) by mouth 2 (two) times daily for 10 days.    Dispense:  20 tablet    Refill:  0    Return if symptoms worsen or fail to improve.  Charlton Amor, DO

## 2023-09-18 NOTE — Telephone Encounter (Signed)
  Chief Complaint: diarrhea, n/v Symptoms: starting yesterday Frequency: ongoing Pertinent Negatives: n/a Disposition: [] ED /[] Urgent Care (no appt availability in office) / [x] Appointment(In office/virtual)/ []  [facility]/ [] Home Care/ [] Refused Recommended Disposition /[] [facility]/ []  Follow-up with PCP Additional Notes: pt states her son was diagnosed with c.diff and placed on ABX. Now she is having the same s/s as son.  Pt would like to have ABX prescribed to help. In clinic appt made to be seen today. Reason for Disposition  [1] SEVERE diarrhea (e.g., 7 or more times / day more than normal) AND [2] age > 60 years  Answer Assessment - Initial Assessment Questions 1. DIARRHEA SEVERITY: "How bad is the diarrhea?" "How many more stools have you had in the past 24 hours than normal?"    - NO DIARRHEA (SCALE 0)   - MILD (SCALE 1-3): Few loose or mushy BMs; increase of 1-3 stools over normal daily number of stools; mild increase in ostomy output.   -  MODERATE (SCALE 4-7): Increase of 4-6 stools daily over normal; moderate increase in ostomy output.   -  SEVERE (SCALE 8-10; OR "WORST POSSIBLE"): Increase of 7 or more stools daily over normal; moderate increase in ostomy output; incontinence.     Ongoing diarrhea since yesterday 2. ONSET: "When did the diarrhea begin?"      yesterday 3. BM CONSISTENCY: "How loose or watery is the diarrhea?"      watery 4. VOMITING: "Are you also vomiting?" If Yes, ask: "How many times in the past 24 hours?"      Comes in episodes, goes away and then comes back 5. ABDOMEN PAIN: "Are you having any abdomen pain?" If Yes, ask: "What does it feel like?" (e.g., crampy, dull, intermittent, constant)      02/10 6. ABDOMEN PAIN SEVERITY: If present, ask: "How bad is the pain?"  (e.g., Scale 1-10; mild, moderate, or severe)   - MILD (1-3): doesn't interfere with normal activities, abdomen soft and not tender to touch    - MODERATE (4-7):  interferes with normal activities or awakens from sleep, abdomen tender to touch    - SEVERE (8-10): excruciating pain, doubled over, unable to do any normal activities       2/10 7. ORAL INTAKE: If vomiting, "Have you been able to drink liquids?" "How much liquids have you had in the past 24 hours?"     Has been drinking and eating but also vomiting 8. HYDRATION: "Any signs of dehydration?" (e.g., dry mouth [not just dry lips], too weak to stand, dizziness, new weight loss) "When did you last urinate?"     None  9. EXPOSURE: "Have you traveled to a foreign country recently?" "Have you been exposed to anyone with diarrhea?" "Could you have eaten any food that was spoiled?"     Son has c.diff and patient thinks she got it from him 10. ANTIBIOTIC USE: "Are you taking antibiotics now or have you taken antibiotics in the past 2 months?"       none 11. OTHER SYMPTOMS: "Do you have any other symptoms?" (e.g., fever, blood in stool)       none 12. PREGNANCY: "Is there any chance you are pregnant?" "When was your last menstrual period?"       N/a  Protocols used: [facility]

## 2023-09-18 NOTE — Assessment & Plan Note (Addendum)
-   pt presents with exposure to c diff in her son. She started to have n/v/diarrhea - will go ahead and start pt on treatment for c diff with fidaxomicin - discussed staying at home until she is free from diarrhea for at least 48hours. Discussed contagiousness as well as hand washing

## 2023-09-25 ENCOUNTER — Ambulatory Visit: Payer: BC Managed Care – PPO | Admitting: Sports Medicine

## 2023-09-25 ENCOUNTER — Encounter: Payer: Self-pay | Admitting: Sports Medicine

## 2023-09-25 DIAGNOSIS — M76821 Posterior tibial tendinitis, right leg: Secondary | ICD-10-CM

## 2023-09-25 NOTE — Progress Notes (Signed)
    Procedures performed today:    None.  Independent interpretation of notes and tests performed by another provider:   None.  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 Her orthotics were good and we added some scaphoid pads at the last visit, she has been doing physical therapy for several months. Due to persistence of pain I would like an MRI for confirmation of the diagnosis and we will likely do a tibialis posterior tendon sheath injection followed by boot immobilization for a week if not sufficiently better. 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    ____________________________________________ Ihor Austin. Benjamin Stain, M.D., ABFM., CAQSM., AME. Primary Care and Sports Medicine [facility]  Adjunct Professor of Family Medicine  [employer] [facility] [employer]  Restaurant manager, fast food

## 2023-09-25 NOTE — Assessment & Plan Note (Signed)
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 Her orthotics were good and we added some scaphoid pads at the last visit, she has been doing physical therapy for several months. Due to persistence of pain I would like an MRI for confirmation of the diagnosis and we will likely do a tibialis posterior tendon sheath injection followed by boot immobilization for a week if not sufficiently better. 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

## 2023-09-30 ENCOUNTER — Ambulatory Visit: Payer: BC Managed Care – PPO

## 2023-09-30 DIAGNOSIS — M76821 Posterior tibial tendinitis, right leg: Secondary | ICD-10-CM | POA: Diagnosis not present

## 2023-10-12 ENCOUNTER — Other Ambulatory Visit (INDEPENDENT_AMBULATORY_CARE_PROVIDER_SITE_OTHER): Payer: BC Managed Care – PPO

## 2023-10-12 ENCOUNTER — Ambulatory Visit: Payer: BC Managed Care – PPO | Admitting: Sports Medicine

## 2023-10-12 DIAGNOSIS — M76821 Posterior tibial tendinitis, right leg: Secondary | ICD-10-CM

## 2023-10-12 MED ORDER — TRIAMCINOLONE ACETONIDE 40 MG/ML IJ SUSP
40.0000 mg | Freq: Once | INTRAMUSCULAR | Status: AC
  Administered 2023-10-12: 40 mg via INTRAMUSCULAR

## 2023-10-12 NOTE — Addendum Note (Signed)
Addended by: Carren Rang A on: 10/12/2023 09:46 AM   Modules accepted: Orders

## 2023-10-12 NOTE — Progress Notes (Signed)
    Procedures performed today:    Procedure: Real-time Ultrasound Guided injection of the right tibialis posterior tendon sheath Device: Samsung HS60  Verbal informed consent obtained.  Time-out conducted.  Noted no overlying erythema, induration, or other signs of local infection.  Skin prepped in a sterile fashion.  Local anesthesia: Topical Ethyl chloride.  With sterile technique and under real time ultrasound guidance: Noted effusion, 1 cc Kenalog 40, 1 cc lidocaine, 1 cc bupivacaine injected easily Completed without difficulty  Advised to call if fevers/chills, erythema, induration, drainage, or persistent bleeding.  Images permanently stored and available for review in PACS.  Impression: Technically successful ultrasound guided injection.  Independent interpretation of notes and tests performed by another provider:   Official MRI report not back yet however on my interpretation the dominant finding is fairly severe tibialis posterior tendinitis with tendon sheath effusion.  Brief History, Exam, Impression, and Recommendations:    Tibialis posterior tendinitis, right This very pleasant 42 year old female returns, she has posterior tibialis tendinitis on the right, x-rays also showed talonavicular and tarsometatarsal arthritis but this was not where her pain was localized, orthotics were good and we added some scaphoid pads, she has been doing physical therapy for several months, we did obtain an MRI, the official report is not back but on my interpretation the dominant finding is fairly severe tibialis posterior tendinitis with tendon sheath effusion. Due to failure of conservative treatment we will proceed with tibialis posterior tendon sheath injection with ultrasound guidance, boot immobilization for at least a week, continue home PT and return to see me in about 6 weeks.    ____________________________________________ Kiara [name]. Kiara [name], M.D., ABFM., CAQSM., AME. Primary  Care and Sports Medicine [facility]  Adjunct Professor of Family Medicine  [employer] [facility] [employer]  Restaurant manager, fast food

## 2023-10-12 NOTE — Assessment & Plan Note (Signed)
This very pleasant 42 year old female returns, she has posterior tibialis tendinitis on the right, x-rays also showed talonavicular and tarsometatarsal arthritis but this was not where her pain was localized, orthotics were good and we added some scaphoid pads, she has been doing physical therapy for several months, we did obtain an MRI, the official report is not back but on my interpretation the dominant finding is fairly severe tibialis posterior tendinitis with tendon sheath effusion. Due to failure of conservative treatment we will proceed with tibialis posterior tendon sheath injection with ultrasound guidance, boot immobilization for at least a week, continue home PT and return to see me in about 6 weeks.

## 2023-10-15 ENCOUNTER — Encounter: Payer: Self-pay | Admitting: Sports Medicine

## 2023-11-20 ENCOUNTER — Telehealth: Payer: 59 | Admitting: Physician Assistant

## 2023-11-20 DIAGNOSIS — R3989 Other symptoms and signs involving the genitourinary system: Secondary | ICD-10-CM | POA: Diagnosis not present

## 2023-11-20 MED ORDER — NITROFURANTOIN MONOHYD MACRO 100 MG PO CAPS: 100.0000 mg | ORAL_CAPSULE | Freq: Two times a day (BID) | ORAL | 0 refills | Status: DC

## 2023-11-20 NOTE — Progress Notes (Signed)
Virtual Visit Consent   [name], you are scheduled for a virtual visit with a [facility] provider today. Just as with appointments in the office, your consent must be obtained to participate. Your consent will be active for this visit and any virtual visit you may have with one of our providers in the next 365 days. If you have a MyChart account, a copy of this consent can be sent to you electronically.  As this is a virtual visit, video technology does not allow for your provider to perform a traditional examination. This may limit your provider's ability to fully assess your condition. If your provider identifies any concerns that need to be evaluated in person or the need to arrange testing (such as labs, EKG, etc.), we will make arrangements to do so. Although advances in technology are sophisticated, we cannot ensure that it will always work on either your end or our end. If the connection with a video visit is poor, the visit may have to be switched to a telephone visit. With either a video or telephone visit, we are not always able to ensure that we have a secure connection.  By engaging in this virtual visit, you consent to the provision of healthcare and authorize for your insurance to be billed (if applicable) for the services provided during this visit. Depending on your insurance coverage, you may receive a charge related to this service.  I need to obtain your verbal consent now. Are you willing to proceed with your visit today? Lowella Fairy Lampley has provided verbal consent on 11/20/2023 for a virtual visit (video or telephone). Margaretann Loveless, PA-C  Date: 11/20/2023 2:12 PM  Virtual Visit via Video Note   I, Margaretann Loveless, connected with  Kiara [name]  ([id number], 08-Aug-1981) on 11/20/23 at  2:15 PM EST by a video-enabled telemedicine application and verified that I am speaking with the correct person using two identifiers.  Location: Patient:  Virtual Visit Location Patient: Home Provider: Virtual Visit Location Provider: Home Office   I discussed the limitations of evaluation and management by telemedicine and the availability of in person appointments. The patient expressed understanding and agreed to proceed.    History of Present Illness: Kiara [name] is a 43 y.o. who identifies as a female who was assigned female at birth, and is being seen today for UTI symptoms.  HPI: Urinary Tract Infection  This is a new problem. The current episode started in the past 7 days (end of last week). The problem occurs every urination. The problem has been unchanged. The quality of the pain is described as aching (twinge of sensation at end of urination). The pain is moderate. There has been no fever. Associated symptoms include frequency, hesitancy and urgency. Pertinent negatives include no chills, discharge, hematuria or nausea. Associated symptoms comments: Strong odor, feeling like not completely emptying. She has tried increased fluids for the symptoms. The treatment provided no relief. Her past medical history is significant for recurrent UTIs.     Problems:  Patient Active Problem List   Diagnosis Date Noted   C. 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5 MG tablet, TAKE 1 TABLET(0.5 MG) BY MOUTH AT BEDTIME AS NEEDED FOR ANXIETY OR SLEEP, Disp: 30 tablet, Rfl: 0   meloxicam (MOBIC) 15 MG tablet, One tab PO every 24 hours with a meal for 2 weeks, then once every 24 hours prn pain., Disp: 30 tablet, Rfl: 3   sertraline (ZOLOFT) 100 MG tablet, TAKE 1 TABLET(100 MG) BY MOUTH AT BEDTIME, Disp: 90 tablet, Rfl: 3  Observations/Objective: Patient is well-developed, well-nourished in no acute distress.  Resting comfortably at home.  Head is normocephalic, atraumatic.  No labored breathing.  Speech is clear and coherent with logical content.  Patient is alert and oriented at baseline.    Assessment and Plan: 1. Suspected UTI (Primary) - nitrofurantoin, macrocrystal-monohydrate, (MACROBID) 100 MG capsule; Take 1 capsule (100 mg total) by mouth 2 (two) times daily.  Dispense: 10 capsule; Refill: 0  - Worsening symptoms.  - Will treat empirically with Macrobid - May use AZO for bladder spasms - Continue to push fluids.  - Seek in person evaluation for urine culture if symptoms do not improve or if they worsen.    Follow Up Instructions: I discussed the assessment and treatment plan with the patient. The patient was provided an opportunity to ask questions and all were answered. The patient agreed with the plan and demonstrated an understanding of the instructions.  A copy of instructions were sent to the patient via MyChart unless otherwise noted below.    The patient was advised to call back or seek an in-person evaluation if the symptoms worsen or if the condition fails to improve as anticipated.    Margaretann Loveless, PA-C

## 2023-11-20 NOTE — Patient Instructions (Signed)
[name], thank you for joining Margaretann Loveless, PA-C for today's virtual visit.  While this provider is not your primary care provider (PCP), if your PCP is located in our provider database this encounter information will be shared with them immediately following your visit.   A [facility] MyChart account gives you access to today's visit and all your visits, tests, and labs performed at [facility] " click here if you don't have a [facility] MyChart account or go to [email or web address]  Consent: (Patient) Kiara [name] provided verbal consent for this virtual visit at the beginning of the encounter.  Current Medications:  Current Outpatient Medications:    nitrofurantoin, macrocrystal-monohydrate, (MACROBID) 100 MG capsule, Take 1 capsule (100 mg total) by mouth 2 (two) times daily., Disp: 10 capsule, Rfl: 0   ALPRAZolam (XANAX) 0.5 MG tablet, TAKE 1 TABLET(0.5 MG) BY MOUTH AT BEDTIME AS NEEDED FOR ANXIETY OR SLEEP, Disp: 30 tablet, Rfl: 0   meloxicam (MOBIC) 15 MG tablet, One tab PO every 24 hours with a meal for 2 weeks, then once every 24 hours prn pain., Disp: 30 tablet, Rfl: 3   sertraline (ZOLOFT) 100 MG tablet, TAKE 1 TABLET(100 MG) BY MOUTH AT BEDTIME, Disp: 90 tablet, Rfl: 3   Medications ordered in this encounter:  Meds ordered this encounter  Medications   nitrofurantoin, macrocrystal-monohydrate, (MACROBID) 100 MG capsule    Sig: Take 1 capsule (100 mg total) by mouth 2 (two) times daily.    Dispense:  10 capsule    Refill:  0    Supervising Provider:   Merrilee Jansky [[id number]]     *If you need refills on other medications prior to your next appointment, please contact your pharmacy*  Follow-Up: Call back or seek an in-person evaluation if the symptoms worsen or if the condition fails to improve as anticipated.  [facility] [phone]  Other Instructions Urinary Tract Infection, Female A urinary  tract infection (UTI) is an infection in your urinary tract. The urinary tract is made up of organs that make, store, and get rid of pee (urine) in your body. These organs include: The kidneys. The ureters. The bladder. The urethra. What are the causes? Most UTIs are caused by germs called bacteria. They may be in or near your genitals. These germs grow and cause swelling in your urinary tract. What increases the risk? You're more likely to get a UTI if: You're a female. The urethra is shorter in females than in males. You have a soft tube called a catheter that drains your pee. You can't control when you pee or poop. You have trouble peeing because of: A kidney stone. A urinary blockage. A nerve condition that affects your bladder. Not getting enough to drink. You're sexually active. You use a birth control inside your vagina, like spermicide. You're pregnant. You have low levels of the hormone estrogen in your body. You're an older adult. You're also more likely to get a UTI if you have other health problems. These may include: Diabetes. A weak immune system. Your immune system is your body's defense system. Sickle cell disease. Injury of the spine. What are the signs or symptoms? Symptoms may include: Needing to pee right away. Peeing small amounts often. Pain or burning when you pee. Blood in your pee. Pee that smells bad or odd. Pain in your belly or lower back. You may also: Feel confused. This may be the first symptom in older adults. Vomit. Not  feel hungry. Feel tired or easily annoyed. Have a fever or chills. How is this diagnosed? A UTI is diagnosed based on your medical history and an exam. You may also have other tests. These may include: Pee tests. Blood tests. Tests for sexually transmitted infections (STIs). If you've had more than one UTI, you may need to have imaging studies done to find out why you keep getting them. How is this treated? A UTI can be  treated by: Taking antibiotics or other medicines. Drinking enough fluid to keep your pee pale yellow. In rare cases, a UTI can cause a very bad condition called sepsis. Sepsis may be treated in the hospital. Follow these instructions at home: Medicines Take your medicines only as told by your health care provider. If you were given antibiotics, take them as told by your provider. Do not stop taking them even if you start to feel better. General instructions Make sure you: Pee often and fully. Do not hold your pee for a long time. Wipe from front to back after you pee or poop. Use each tissue only once when you wipe. Pee after you have sex. Do not douche or use sprays or powders in your genital area. Contact a health care provider if: Your symptoms don't get better after 1-2 days of taking antibiotics. Your symptoms go away and then come back. You have a fever or chills. You vomit or feel like you may vomit. Get help right away if: You have very bad pain in your back or lower belly. You faint. This information is not intended to replace advice given to you by your health care provider. Make sure you discuss any questions you have with your health care provider. Document Revised: 05/11/2023 Document Reviewed: 01/06/2023 Elsevier Patient Education  2024 Elsevier Inc.   If you have been instructed to have an in-person evaluation today at a local Urgent Care facility, please use the link below. It will take you to a list of all of our available [facility], including address, phone number and hours of operation. Please do not delay care.  [facility]  If you or a family member do not have a primary care provider, use the link below to schedule a visit and establish care. When you choose a [facility] primary care physician or advanced practice provider, you gain a long-term partner in health. Find a Primary Care Provider  Learn more about [facility]  in-office and virtual care options: [facility] - Get Care Now

## 2023-11-23 ENCOUNTER — Ambulatory Visit: Payer: 59 | Admitting: Sports Medicine

## 2023-11-23 DIAGNOSIS — M76821 Posterior tibial tendinitis, right leg: Secondary | ICD-10-CM

## 2023-11-23 NOTE — Assessment & Plan Note (Signed)
 This is a very pleasant 43 year old female, she has posterior tibialis tendinitis on the right, x-rays showed talonavicular and tarsometatarsal osteoarthritis but her pain was localized over the tendon itself, we modified her three-quarter length orthotics with scaphoid pads and she did physical therapy for several months. Ultimately an MRI did show fairly severe tibialis posterior tendinitis with a tendon sheath effusion. I injected her tibialis posterior sheath with ultrasound guidance at the end of December, we have immobilized her in a boot for a week, she continue PT and returns today completely symptom-free. I added additional scaphoid pads in her orthotics, we have added some home physical therapy and she can return to see me as needed.

## 2023-11-23 NOTE — Progress Notes (Signed)
    Procedures performed today:    None.  Independent interpretation of notes and tests performed by another provider:   None.  Brief History, Exam, Impression, and Recommendations:    Tibialis posterior tendinitis, right This is a very pleasant 43 year old female, she has posterior tibialis tendinitis on the right, x-rays showed talonavicular and tarsometatarsal osteoarthritis but her pain was localized over the tendon itself, we modified her three-quarter length orthotics with scaphoid pads and she did physical therapy for several months. Ultimately an MRI did show fairly severe tibialis posterior tendinitis with a tendon sheath effusion. I injected her tibialis posterior sheath with ultrasound guidance at the end of December, we have immobilized her in a boot for a week, she continue PT and returns today completely symptom-free. I added additional scaphoid pads in her orthotics, we have added some home physical therapy and she can return to see me as needed.    ____________________________________________ Debby PARAS. Curtis, M.D., ABFM., CAQSM., AME. Primary Care and Sports Medicine [facility]  Adjunct Professor of [employer] [facility] Medicine  [employer]  Restaurant Manager, [employer]

## 2023-12-12 ENCOUNTER — Encounter: Payer: Self-pay | Admitting: Sports Medicine

## 2023-12-12 ENCOUNTER — Ambulatory Visit (INDEPENDENT_AMBULATORY_CARE_PROVIDER_SITE_OTHER): Payer: 59 | Admitting: Sports Medicine

## 2023-12-12 DIAGNOSIS — M76821 Posterior tibial tendinitis, right leg: Secondary | ICD-10-CM | POA: Diagnosis not present

## 2023-12-12 NOTE — Progress Notes (Signed)
    Procedures performed today:    None.  Independent interpretation of notes and tests performed by another provider:   None.  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 We injected her talus posterior tendon sheath with ultrasound guidance at the end of December and we immobilized her in a boot for at least 1 week afterwards. She continue with PT, I saw her back in early February and she was completely pain-free, good motion, good strength. She continued with home physical therapy. Unfortunately she took a misstep and felt a pop, since then she has had severe weakness. On exam I am unable to palpate her tibialis posterior, she has no other discrete areas of pain. We will proceed with MRI of the foot ankle for concern for tibialis posterior tear, I would like her to get back into the boot and we will going get her a consult with Dr. Ihor Gully with foot and ankle surgery.    ____________________________________________ Ihor Austin. Benjamin Stain, M.D., ABFM., CAQSM., AME. Primary Care and Sports Medicine [facility]  Adjunct Professor of Family Medicine  [employer] [facility] [employer]  Restaurant manager, fast food

## 2023-12-12 NOTE — Assessment & Plan Note (Signed)
 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 We injected her talus posterior tendon sheath with ultrasound guidance at the end of December and we immobilized her in a boot for at least 1 week afterwards. She continue with PT, I saw her back in early February and she was completely pain-free, good motion, good strength. She continued with home physical therapy. Unfortunately she took a misstep and felt a pop, since then she has had severe weakness. On exam I am unable to palpate her tibialis posterior, she has no other discrete areas of pain. We will proceed with MRI of the foot ankle for concern for tibialis posterior tear, I would like her to get back into the boot and we will going get her a consult with Dr. Ihor Gully with foot and ankle surgery.

## 2023-12-16 ENCOUNTER — Other Ambulatory Visit: Payer: 59

## 2023-12-18 ENCOUNTER — Encounter: Payer: Self-pay | Admitting: Sports Medicine

## 2023-12-20 ENCOUNTER — Ambulatory Visit
Admission: RE | Admit: 2023-12-20 | Discharge: 2023-12-20 | Disposition: A | Discharge status: Home / Self Care | Source: Ambulatory Visit | Attending: Emergency Medicine | Admitting: Emergency Medicine

## 2023-12-20 ENCOUNTER — Other Ambulatory Visit: Payer: Self-pay

## 2023-12-20 VITALS — BP 112/80 | HR 92 | Temp 98.2°F | Resp 16

## 2023-12-20 DIAGNOSIS — H60501 Unspecified acute noninfective otitis externa, right ear: Secondary | ICD-10-CM | POA: Diagnosis not present

## 2023-12-20 MED ORDER — CIPROFLOXACIN-DEXAMETHASONE 0.3-0.1 % OT SUSP: 4.0000 [drp] | Freq: Two times a day (BID) | OTIC | 0 refills | Status: AC

## 2023-12-20 NOTE — Discharge Instructions (Addendum)
 CiproDex ear drops -- twice daily in the right ear for 5 days You can also take daily zyrtec/allegra/claritin to help with any itching

## 2023-12-20 NOTE — ED Provider Notes (Signed)
 [facility]    CSN: [id number] Arrival date & time: 12/20/23  0903     History   Chief Complaint Chief Complaint  Patient presents with   Ear Fullness    HPI Kiara [name] is a 43 y.o. female.  2-day history of right ear itching/irritation She may have gotten some water in it a few weekends ago Not having pain No fever  History reviewed. No pertinent past medical history.  Patient Active Problem List   Diagnosis Date Noted   C. difficile diarrhea 09/18/2023   Exposure to strep throat 05/08/2023   Pes planus of both feet 04/10/2023   Tibialis posterior tendinitis, right 04/10/2023   Encounter for weight management 11/18/2021   Anal skin tag 11/01/2019   Outlet dysfunction constipation 09/26/2019   Hemorrhoids 09/17/2019   Carrier of group B Streptococcus 05/28/2019   Acute bilateral low back pain without sciatica 12/25/2017   Abnormal mammogram of right breast 01/02/2017   Acquired ankle pronation 01/08/2016   Snoring 01/29/2014   Anxiety disorder 12/06/2013   Cold sore 03/02/2011   Herpes simplex 03/02/2011   INSOMNIA 05/26/2009    Past Surgical History:  Procedure Laterality Date   RHINOPLASTY  2007, 2009   TUBAL LIGATION Bilateral 12/08/2020    OB History   No obstetric history on file.      Home Medications    Prior to Admission medications   Medication Sig Start Date End Date Taking? Authorizing Provider  ciprofloxacin-dexamethasone (CIPRODEX) OTIC suspension Place 4 drops into the right ear 2 (two) times daily for 5 days. 12/20/23 12/25/23 Yes Margret Moat, Lurena Joiner, PA-C  ALPRAZolam (XANAX) 0.5 MG tablet TAKE 1 TABLET(0.5 MG) BY MOUTH AT BEDTIME AS NEEDED FOR ANXIETY OR SLEEP 09/12/23   Agapito Games, MD  sertraline (ZOLOFT) 100 MG tablet TAKE 1 TABLET(100 MG) BY MOUTH AT BEDTIME 09/12/23   Agapito Games, MD    Family History Family History  Problem Relation Age of Onset   Breast cancer Maternal Grandmother     Hyperlipidemia Father    Hypertension Father     Social History Social History   Tobacco Use   Smoking status: Former    Current packs/day: 0.00    Types: Cigarettes    Quit date: 10/17/2012    Years since quitting: 11.1   Smokeless tobacco: Never  Substance Use Topics   Alcohol use: Yes   Drug use: No     Allergies   Citalopram   Review of Systems Review of Systems Per HPI  Physical Exam Triage Vital Signs ED Triage Vitals  Encounter Vitals Group     BP 12/20/23 0912 112/80     Systolic BP Percentile --      Diastolic BP Percentile --      Pulse Rate 12/20/23 0912 92     Resp 12/20/23 0912 16     Temp 12/20/23 0912 98.2 F (36.8 C)     Temp src --      SpO2 12/20/23 0912 98 %     Weight --      Height --      Head Circumference --      Peak Flow --      Pain Score 12/20/23 0913 1     Pain Loc --      Pain Education --      Exclude from Growth Chart --    No data found.  Updated Vital Signs BP 112/80   Pulse 92  Temp 98.2 F (36.8 C)   Resp 16   LMP 12/14/2023 (Approximate)   SpO2 98%    Physical Exam Vitals and nursing note reviewed.  Constitutional:      General: She is not in acute distress.    Appearance: She is not ill-appearing.  HENT:     Right Ear: Tympanic membrane normal.     Left Ear: Tympanic membrane and ear canal normal.     Ears:     Comments: Minor irritation of the right canal    Nose: Nose normal.     Mouth/Throat:     Mouth: Mucous membranes are moist.     Pharynx: Oropharynx is clear.  Eyes:     Conjunctiva/sclera: Conjunctivae normal.  Cardiovascular:     Rate and Rhythm: Normal rate and regular rhythm.  Pulmonary:     Effort: Pulmonary effort is normal.  Musculoskeletal:     Cervical back: Normal range of motion.  Skin:    General: Skin is warm and dry.  Neurological:     Mental Status: She is alert and oriented to person, place, and time.     UC Treatments / Results  Labs (all labs ordered are listed,  but only abnormal results are displayed) Labs Reviewed - No data to display  EKG  Radiology No results found.  Procedures Procedures   Medications Ordered in UC Medications - No data to display  Initial Impression / Assessment and Plan / UC Course  I have reviewed the triage vital signs and the nursing notes.  Pertinent labs & imaging results that were available during my care of the patient were reviewed by me and considered in my medical decision making (see chart for details).  A little irritation of the right ear canal.  Could be start of otitis externa Sent CiproDex to use twice daily for 5 days Also recommend starting daily allergy medicine for any itch Patient agrees to plan, no questions at this time Can return if needed  Final Clinical Impressions(s) / UC Diagnoses   Final diagnoses:  Acute otitis externa of right ear, unspecified type     Discharge Instructions      CiproDex ear drops -- twice daily in the right ear for 5 days You can also take daily zyrtec/allegra/claritin to help with any itching    ED Prescriptions     Medication Sig Dispense Auth. Provider   ciprofloxacin-dexamethasone (CIPRODEX) OTIC suspension Place 4 drops into the right ear 2 (two) times daily for 5 days. 7.5 mL Jamilynn Whitacre, Lurena Joiner, PA-C      PDMP not reviewed this encounter.   Marlow Baars, [place] 12/20/23 [phone]

## 2023-12-20 NOTE — ED Triage Notes (Signed)
 C/o right ear discomfort x few days. 2 weekends ago went swimming, is unsure if it is related. No fever. No otc medications.

## 2024-01-09 ENCOUNTER — Telehealth: Admitting: Physician Assistant

## 2024-01-09 DIAGNOSIS — B9689 Other specified bacterial agents as the cause of diseases classified elsewhere: Secondary | ICD-10-CM

## 2024-01-09 DIAGNOSIS — J208 Acute bronchitis due to other specified organisms: Secondary | ICD-10-CM

## 2024-01-09 MED ORDER — AZITHROMYCIN 250 MG PO TABS: ORAL_TABLET | ORAL | 0 refills | Status: AC

## 2024-01-09 MED ORDER — ALBUTEROL SULFATE HFA 108 (90 BASE) MCG/ACT IN AERS: 2.0000 | INHALATION_SPRAY | Freq: Four times a day (QID) | RESPIRATORY_TRACT | 0 refills | Status: AC | PRN

## 2024-01-09 NOTE — Patient Instructions (Signed)
 Kiara [name], thank you for joining [name], PA-C for today's virtual visit.  While this provider is not your primary care provider (PCP), if your PCP is located in our provider database this encounter information will be shared with them immediately following your visit.   A [facility] MyChart account gives you access to today's visit and all your visits, tests, and labs performed at [facility] " click here if you don't have a [facility] MyChart account or go to [email or web address]  Consent: (Patient) Kiara [name] provided verbal consent for this virtual visit at the beginning of the encounter.  Current Medications:  Current Outpatient Medications:    ALPRAZolam (XANAX) 0.5 MG tablet, TAKE 1 TABLET(0.5 MG) BY MOUTH AT BEDTIME AS NEEDED FOR ANXIETY OR SLEEP, Disp: 30 tablet, Rfl: 0   sertraline (ZOLOFT) 100 MG tablet, TAKE 1 TABLET(100 MG) BY MOUTH AT BEDTIME, Disp: 90 tablet, Rfl: 3   Medications ordered in this encounter:  No orders of the defined types were placed in this encounter.    *If you need refills on other medications prior to your next appointment, please contact your pharmacy*  Follow-Up: Call back or seek an in-person evaluation if the symptoms worsen or if the condition fails to improve as anticipated.  [facility] [phone]  Other Instructions Take antibiotic (Azithromycin) as directed.  Increase fluids.  Get plenty of rest. Use Mucinex for congestion. Start the albuterol inhaler as directed. Take a daily probiotic (I recommend Align or Culturelle, but even Activia Yogurt may be beneficial).  A humidifier placed in the bedroom may offer some relief for a dry, scratchy throat of nasal irritation.  Read information below on acute bronchitis. Please call or return to clinic if symptoms are not improving.  Acute Bronchitis Bronchitis is when the airways that extend from the windpipe into the lungs get  red, puffy, and painful (inflamed). Bronchitis often causes thick spit (mucus) to develop. This leads to a cough. A cough is the most common symptom of bronchitis. In acute bronchitis, the condition usually begins suddenly and goes away over time (usually in 2 weeks). Smoking, allergies, and asthma can make bronchitis worse. Repeated episodes of bronchitis may cause more lung problems.  HOME CARE Rest. Drink enough fluids to keep your pee (urine) clear or pale yellow (unless you need to limit fluids as told by your doctor). Only take over-the-counter or prescription medicines as told by your doctor. Avoid smoking and secondhand smoke. These can make bronchitis worse. If you are a smoker, think about using nicotine gum or skin patches. Quitting smoking will help your lungs heal faster. Reduce the chance of getting bronchitis again by: Washing your hands often. Avoiding people with cold symptoms. Trying not to touch your hands to your mouth, nose, or eyes. Follow up with your doctor as told.  GET HELP IF: Your symptoms do not improve after 1 week of treatment. Symptoms include: Cough. Fever. Coughing up thick spit. Body aches. Chest congestion. Chills. Shortness of breath. Sore throat.  GET HELP RIGHT AWAY IF:  You have an increased fever. You have chills. You have severe shortness of breath. You have bloody thick spit (sputum). You throw up (vomit) often. You lose too much body fluid (dehydration). You have a severe headache. You faint.  MAKE SURE YOU:  Understand these instructions. Will watch your condition. Will get help right away if you are not doing well or get worse. Document Released: 03/21/2008 Document Revised: 06/05/2013  Document Reviewed: 03/26/2013 [facility] Patient Information 2015 [place], [place]. This information is not intended to replace advice given to you by your health care provider. Make sure you discuss any questions you have with your health care  provider.    If you have been instructed to have an in-person evaluation today at a local Urgent Care facility, please use the link below. It will take you to a list of all of our available [facility], including address, phone number and hours of operation. Please do not delay care.  [facility]  If you or a family member do not have a primary care provider, use the link below to schedule a visit and establish care. When you choose a [facility] primary care physician or advanced practice provider, you gain a long-term partner in health. Find a Primary Care Provider  Learn more about [facility] in-office and virtual care options: [facility] - Get Care Now

## 2024-01-09 NOTE — Progress Notes (Signed)
 Virtual Visit Consent   Kiara [name], you are scheduled for a virtual visit with a [facility] provider today. Just as with appointments in the office, your consent must be obtained to participate. Your consent will be active for this visit and any virtual visit you may have with one of our providers in the next 365 days. If you have a MyChart account, a copy of this consent can be sent to you electronically.  As this is a virtual visit, video technology does not allow for your provider to perform a traditional examination. This may limit your provider's ability to fully assess your condition. If your provider identifies any concerns that need to be evaluated in person or the need to arrange testing (such as labs, EKG, etc.), we will make arrangements to do so. Although advances in technology are sophisticated, we cannot ensure that it will always work on either your end or our end. If the connection with a video visit is poor, the visit may have to be switched to a telephone visit. With either a video or telephone visit, we are not always able to ensure that we have a secure connection.  By engaging in this virtual visit, you consent to the provision of healthcare and authorize for your insurance to be billed (if applicable) for the services provided during this visit. Depending on your insurance coverage, you may receive a charge related to this service.  I need to obtain your verbal consent now. Are you willing to proceed with your visit today? Kiara [name] has provided verbal consent on 01/09/2024 for a virtual visit (video or telephone). Kiara [name], [place]  Date: 01/09/2024 6:03 PM   Virtual Visit via Video Note   I, Kiara [name], connected with  Kyrra Prada Corriveau  ([id number], Nov 06, 1980) on 01/09/24 at  6:00 PM EDT by a video-enabled telemedicine application and verified that I am speaking with the correct person using two  identifiers.  Location: Patient: Virtual Visit Location Patient: Home Provider: Virtual Visit Location Provider: Home Office   I discussed the limitations of evaluation and management by telemedicine and the availability of in person appointments. The patient expressed understanding and agreed to proceed.    History of Present Illness: Kiara [name] is a 43 y.o. who identifies as a female who was assigned female at birth, and is being seen today for URI symptoms. Notes about 3-4 weeks ago having a viral URI that mostly resolved except for a lingering cough that has persisted since then and is at times productive of thick phlegm, associated with ongoing chest congestion and now a a "rattle" in her chest. Has been taking OTC aleve and tylenol.    HPI: HPI  Problems:  Patient Active Problem List   Diagnosis Date Noted   C. difficile diarrhea 09/18/2023   Exposure to strep throat 05/08/2023   Pes planus of both feet 04/10/2023   Tibialis posterior tendinitis, right 04/10/2023   Encounter for weight management 11/18/2021   Anal skin tag 11/01/2019   Outlet dysfunction constipation 09/26/2019   Hemorrhoids 09/17/2019   Carrier of group B Streptococcus 05/28/2019   Acute bilateral low back pain without sciatica 12/25/2017   Abnormal mammogram of right breast 01/02/2017   Acquired ankle pronation 01/08/2016   Snoring 01/29/2014   Anxiety disorder 12/06/2013   Cold sore 03/02/2011   Herpes simplex 03/02/2011   INSOMNIA 05/26/2009    Allergies:  Allergies  Allergen Reactions   Citalopram Palpitations  tachycardia tachycardia   Medications:  Current Outpatient Medications:    albuterol (VENTOLIN HFA) 108 (90 Base) MCG/ACT inhaler, Inhale 2 puffs into the lungs every 6 (six) hours as needed for wheezing or shortness of breath., Disp: 8 g, Rfl: 0   azithromycin (ZITHROMAX) 250 MG tablet, Take 2 tablets on day 1, then 1 tablet daily on days 2 through 5, Disp: 6 tablet, Rfl:  0   ALPRAZolam (XANAX) 0.5 MG tablet, TAKE 1 TABLET(0.5 MG) BY MOUTH AT BEDTIME AS NEEDED FOR ANXIETY OR SLEEP, Disp: 30 tablet, Rfl: 0   sertraline (ZOLOFT) 100 MG tablet, TAKE 1 TABLET(100 MG) BY MOUTH AT BEDTIME, Disp: 90 tablet, Rfl: 3  Observations/Objective: Patient is well-developed, well-nourished in no acute distress.  Resting comfortably at home.  Head is normocephalic, atraumatic.  No labored breathing. Speech is clear and coherent with logical content.  Patient is alert and oriented at baseline.   Assessment and Plan: 1. Acute bacterial bronchitis (Primary) - albuterol (VENTOLIN HFA) 108 (90 Base) MCG/ACT inhaler; Inhale 2 puffs into the lungs every 6 (six) hours as needed for wheezing or shortness of breath.  Dispense: 8 g; Refill: 0 - azithromycin (ZITHROMAX) 250 MG tablet; Take 2 tablets on day 1, then 1 tablet daily on days 2 through 5  Dispense: 6 tablet; Refill: 0  Rx Azithromycin.  Increase fluids.  Rest.  Saline nasal spray.  Probiotic.  Mucinex as directed.  Humidifier in bedroom. Albuterol per orders.  Call or return to clinic if symptoms are not improving.   Follow Up Instructions: I discussed the assessment and treatment plan with the patient. The patient was provided an opportunity to ask questions and all were answered. The patient agreed with the plan and demonstrated an understanding of the instructions.  A copy of instructions were sent to the patient via MyChart unless otherwise noted below.   The patient was advised to call back or seek an in-person evaluation if the symptoms worsen or if the condition fails to improve as anticipated.    Kiara Climes, PA-C

## 2024-02-07 ENCOUNTER — Other Ambulatory Visit: Payer: Self-pay | Admitting: Family Medicine

## 2024-02-07 DIAGNOSIS — F418 Other specified anxiety disorders: Secondary | ICD-10-CM

## 2024-03-12 ENCOUNTER — Ambulatory Visit: Payer: BC Managed Care – PPO | Admitting: Family Medicine

## 2024-03-17 ENCOUNTER — Telehealth: Admitting: Family

## 2024-03-17 DIAGNOSIS — R399 Unspecified symptoms and signs involving the genitourinary system: Secondary | ICD-10-CM

## 2024-03-17 MED ORDER — CEPHALEXIN 500 MG PO CAPS: 500.0000 mg | ORAL_CAPSULE | Freq: Two times a day (BID) | ORAL | 0 refills | Status: DC

## 2024-03-17 NOTE — Progress Notes (Signed)

## 2024-04-09 ENCOUNTER — Ambulatory Visit: Admitting: Family Medicine

## 2024-05-16 ENCOUNTER — Ambulatory Visit: Admitting: Family Medicine

## 2024-05-16 ENCOUNTER — Encounter: Payer: Self-pay | Admitting: Family Medicine

## 2024-05-16 VITALS — BP 105/81 | HR 90 | Ht 70.0 in | Wt 190.0 lb

## 2024-05-16 DIAGNOSIS — F418 Other specified anxiety disorders: Secondary | ICD-10-CM | POA: Diagnosis not present

## 2024-05-16 DIAGNOSIS — Z13220 Encounter for screening for lipoid disorders: Secondary | ICD-10-CM

## 2024-05-16 DIAGNOSIS — G4700 Insomnia, unspecified: Secondary | ICD-10-CM

## 2024-05-16 MED ORDER — ALPRAZOLAM 0.5 MG PO TABS: ORAL_TABLET | ORAL | 1 refills | Status: AC

## 2024-05-16 MED ORDER — TRAZODONE HCL 50 MG PO TABS: 25.0000 mg | ORAL_TABLET | Freq: Every evening | ORAL | 3 refills | Status: AC | PRN

## 2024-05-16 NOTE — Assessment & Plan Note (Signed)
 She is actually doing really well with her mood she is happy with her sertraline  and does need a refill on her Xanax .  Will update prescriptions and send to pharmacy follow-up in 6 months.  She is struggling with sleep.

## 2024-05-16 NOTE — Assessment & Plan Note (Signed)
 He is interested in a trial of trazodone .  We discussed starting with a low-dose half a tab and then going up to a whole tab if needed if she does not tolerate it well or if it is not effective then I do not want to necessarily go higher because she is already on sertraline .  Continue to work on sleep hygiene.

## 2024-05-16 NOTE — Progress Notes (Signed)
 Established Patient Office Visit  Subjective  Patient ID: Kiara [name], female    DOB: 01-Jul-1981  Age: 43 y.o. MRN: [id number]  Chief Complaint  Patient presents with   Medical Management of Chronic Issues    Other specified anxiety disorders      HPI  Wants to discuss sleep issues.  Her biggest issue is work some of the stressors at work are affecting her sleep quality at least once a week.  She will sometimes use the alprazolam  to help her sleep but then does not feel good the next day.  She feels like she is doing well in regards to her anxiety with her sertraline  and as needed alprazolam .  She does need some refills today.  Work has been particular stressors for her.  Her son will actually be starting kindergarten in August which she is excited about.    ROS    Objective:     BP 105/81   Pulse 90   Ht 5' 10 (1.778 m)   Wt 190 lb (86.2 kg)   SpO2 99%   BMI 27.26 kg/m    Physical Exam Vitals and nursing note reviewed.  Constitutional:      Appearance: Normal appearance.  HENT:     Head: Normocephalic and atraumatic.  Eyes:     Conjunctiva/sclera: Conjunctivae normal.  Cardiovascular:     Rate and Rhythm: Normal rate and regular rhythm.  Pulmonary:     Effort: Pulmonary effort is normal.     Breath sounds: Normal breath sounds.  Skin:    General: Skin is warm and dry.  Neurological:     Mental Status: She is alert.  Psychiatric:        Mood and Affect: Mood normal.      No results found for any visits on 05/16/24.    The 10-year ASCVD risk score (Arnett DK, et al., 2019) is: 0.2%    Assessment & Plan:   Problem List Items Addressed This Visit       Other   INSOMNIA   He is interested in a trial of trazodone .  We discussed starting with a low-dose half a tab and then going up to a whole tab if needed if she does not tolerate it well or if it is not effective then I do not want to necessarily go higher because she is already on  sertraline .  Continue to work on sleep hygiene.      Relevant Medications   traZODone  (DESYREL ) 50 MG tablet   Other Relevant Orders   CMP14+EGFR   Lipid panel   CBC   TSH   Hepatitis C Antibody   Anxiety disorder - Primary   She is actually doing really well with her mood she is happy with her sertraline  and does need a refill on her Xanax .  Will update prescriptions and send to pharmacy follow-up in 6 months.  She is struggling with sleep.      Relevant Medications   traZODone  (DESYREL ) 50 MG tablet   ALPRAZolam  (XANAX ) 0.5 MG tablet   Other Relevant Orders   CMP14+EGFR   Lipid panel   CBC   TSH   Other Visit Diagnoses       Screening, lipid       Relevant Orders   CMP14+EGFR   Lipid panel   CBC   TSH   Hepatitis C Antibody       Return in about 6 months (around 11/16/2024) for Mood.  Dorothyann Byars, MD

## 2024-06-18 ENCOUNTER — Encounter: Payer: Self-pay | Admitting: Sports Medicine

## 2024-10-03 ENCOUNTER — Other Ambulatory Visit: Payer: Self-pay | Admitting: *Deleted

## 2024-10-03 DIAGNOSIS — F418 Other specified anxiety disorders: Secondary | ICD-10-CM

## 2024-10-03 MED ORDER — SERTRALINE HCL 100 MG PO TABS: ORAL_TABLET | ORAL | 3 refills | Status: AC
